# Patient Record
Sex: Male | Born: 1965
Health system: Southern US, Community
[De-identification: ages and names within clinical notes are randomized; demographics above are authoritative.]

## PROBLEM LIST (undated history)

## (undated) DIAGNOSIS — B20 Human immunodeficiency virus [HIV] disease: Secondary | ICD-10-CM

## (undated) HISTORY — DX: Human immunodeficiency virus (HIV) disease: B20

---

## 1997-07-19 ENCOUNTER — Emergency Department (HOSPITAL_COMMUNITY): Admission: EM | Admit: 1997-07-19 | Discharge: 1997-07-19 | Payer: Self-pay | Admitting: *Deleted

## 1997-08-01 ENCOUNTER — Emergency Department (HOSPITAL_COMMUNITY): Admission: EM | Admit: 1997-08-01 | Discharge: 1997-08-01 | Payer: Self-pay | Admitting: Emergency Medicine

## 1997-08-25 ENCOUNTER — Encounter: Admission: RE | Admit: 1997-08-25 | Discharge: 1997-08-25 | Payer: Self-pay | Admitting: *Deleted

## 1998-08-09 ENCOUNTER — Ambulatory Visit (HOSPITAL_COMMUNITY): Admission: EM | Admit: 1998-08-09 | Discharge: 1998-08-10 | Payer: Self-pay | Admitting: Emergency Medicine

## 1998-08-10 ENCOUNTER — Encounter (INDEPENDENT_AMBULATORY_CARE_PROVIDER_SITE_OTHER): Payer: Self-pay | Admitting: Specialist

## 1998-08-10 ENCOUNTER — Encounter: Payer: Self-pay | Admitting: Emergency Medicine

## 2006-05-23 ENCOUNTER — Emergency Department (HOSPITAL_COMMUNITY): Admission: EM | Admit: 2006-05-23 | Discharge: 2006-05-23 | Payer: Self-pay | Admitting: Family Medicine

## 2007-07-03 ENCOUNTER — Emergency Department (HOSPITAL_COMMUNITY): Admission: EM | Admit: 2007-07-03 | Discharge: 2007-07-03 | Payer: Self-pay | Admitting: Emergency Medicine

## 2007-10-09 ENCOUNTER — Emergency Department (HOSPITAL_COMMUNITY): Admission: EM | Admit: 2007-10-09 | Discharge: 2007-10-09 | Payer: Self-pay | Admitting: Emergency Medicine

## 2009-06-09 ENCOUNTER — Emergency Department (HOSPITAL_COMMUNITY)
Admission: EM | Admit: 2009-06-09 | Discharge: 2009-06-09 | Payer: Self-pay | Source: Home / Self Care | Admitting: Family Medicine

## 2010-01-01 ENCOUNTER — Emergency Department (HOSPITAL_COMMUNITY): Admission: EM | Admit: 2010-01-01 | Discharge: 2010-01-01 | Payer: Self-pay | Admitting: Family Medicine

## 2010-04-23 ENCOUNTER — Ambulatory Visit (HOSPITAL_COMMUNITY)
Admission: RE | Admit: 2010-04-23 | Discharge: 2010-04-23 | Disposition: A | Discharge status: Home / Self Care | Payer: Managed Care, Other (non HMO) | Source: Ambulatory Visit | Attending: Internal Medicine | Admitting: Internal Medicine

## 2010-04-23 ENCOUNTER — Ambulatory Visit (INDEPENDENT_AMBULATORY_CARE_PROVIDER_SITE_OTHER): Payer: Managed Care, Other (non HMO) | Admitting: Internal Medicine

## 2010-04-23 ENCOUNTER — Encounter: Payer: Self-pay | Admitting: Internal Medicine

## 2010-04-23 ENCOUNTER — Other Ambulatory Visit: Payer: Self-pay | Admitting: Internal Medicine

## 2010-04-23 DIAGNOSIS — B20 Human immunodeficiency virus [HIV] disease: Secondary | ICD-10-CM | POA: Insufficient documentation

## 2010-04-23 DIAGNOSIS — B370 Candidal stomatitis: Secondary | ICD-10-CM | POA: Insufficient documentation

## 2010-04-23 DIAGNOSIS — L989 Disorder of the skin and subcutaneous tissue, unspecified: Secondary | ICD-10-CM | POA: Insufficient documentation

## 2010-04-23 DIAGNOSIS — R05 Cough: Secondary | ICD-10-CM

## 2010-04-23 DIAGNOSIS — R64 Cachexia: Secondary | ICD-10-CM | POA: Insufficient documentation

## 2010-04-23 DIAGNOSIS — F17200 Nicotine dependence, unspecified, uncomplicated: Secondary | ICD-10-CM

## 2010-04-23 DIAGNOSIS — R059 Cough, unspecified: Secondary | ICD-10-CM | POA: Insufficient documentation

## 2010-04-24 ENCOUNTER — Encounter: Payer: Self-pay | Admitting: Internal Medicine

## 2010-04-24 LAB — CONVERTED CEMR LAB
ALT: 15 units/L (ref 0–53)
AST: 17 units/L (ref 0–37)
Absolute CD4: 24 #/uL — ABNORMAL LOW (ref 381–1469)
Albumin: 4 g/dL (ref 3.5–5.2)
Alkaline Phosphatase: 66 units/L (ref 39–117)
BUN: 17 mg/dL (ref 6–23)
Basophils Absolute: 0 10*3/uL (ref 0.0–0.1)
Basophils Relative: 0 % (ref 0–1)
Bilirubin, Direct: 0.1 mg/dL (ref 0.0–0.3)
CD4 T Helper %: 4 % — ABNORMAL LOW (ref 32–62)
CO2: 27 meq/L (ref 19–32)
Calcium: 8.6 mg/dL (ref 8.4–10.5)
Chloride: 106 meq/L (ref 96–112)
Creatinine, Ser: 1.04 mg/dL (ref 0.40–1.50)
Cytomegalovirus Ab-IgG: POSITIVE — AB
Eosinophils Absolute: 0.1 10*3/uL (ref 0.0–0.7)
Eosinophils Relative: 2 % (ref 0–5)
Glucose, Bld: 85 mg/dL (ref 70–99)
HCT: 38.3 % — ABNORMAL LOW (ref 39.0–52.0)
HCV Ab: NEGATIVE
HIV 1 RNA Quant: 114000 copies/mL — ABNORMAL HIGH (ref <20)
HIV-1 RNA Quant, Log: 5.06 — ABNORMAL HIGH (ref <1.30)
Hemoglobin: 12.9 g/dL — ABNORMAL LOW (ref 13.0–17.0)
Hep A Total Ab: POSITIVE — AB
Hep B Core Total Ab: POSITIVE — AB
Hep B S Ab: POSITIVE — AB
Indirect Bilirubin: 0.2 mg/dL (ref 0.0–0.9)
Lymphocytes Relative: 10 % — ABNORMAL LOW (ref 12–46)
Lymphs Abs: 0.6 10*3/uL — ABNORMAL LOW (ref 0.7–4.0)
MCHC: 33.7 g/dL (ref 30.0–36.0)
MCV: 83.3 fL (ref 78.0–100.0)
Monocytes Absolute: 0.6 10*3/uL (ref 0.1–1.0)
Monocytes Relative: 10 % (ref 3–12)
Neutro Abs: 4.6 10*3/uL (ref 1.7–7.7)
Neutrophils Relative %: 77 % (ref 43–77)
Platelets: 223 10*3/uL (ref 150–400)
Potassium: 3.6 meq/L (ref 3.5–5.3)
RBC: 4.6 M/uL (ref 4.22–5.81)
RDW: 14 % (ref 11.5–15.5)
Sodium: 139 meq/L (ref 135–145)
TSH: 0.104 microintl units/mL — ABNORMAL LOW (ref 0.350–4.500)
Total Bilirubin: 0.3 mg/dL (ref 0.3–1.2)
Total Protein: 6.6 g/dL (ref 6.0–8.3)
Total lymphocyte count: 600 cells/mcL — ABNORMAL LOW (ref 700–3300)
Toxoplasma Antibody- IgM: 0.4
Toxoplasma IgG Ratio: <0.5
WBC: 6 10*3/uL (ref 4.0–10.5)

## 2010-04-24 LAB — T-HELPER CELLS (CD4) COUNT (NOT AT ARMC)
CD4 % Helper T Cell: 3 % — ABNORMAL LOW (ref 33–55)
CD4 T Cell Abs: 20 uL — ABNORMAL LOW (ref 400–2700)

## 2010-04-25 ENCOUNTER — Telehealth: Payer: Self-pay | Admitting: Internal Medicine

## 2010-04-26 ENCOUNTER — Telehealth: Payer: Self-pay | Admitting: Internal Medicine

## 2010-04-30 ENCOUNTER — Encounter: Payer: Self-pay | Admitting: Internal Medicine

## 2010-05-01 NOTE — Assessment & Plan Note (Signed)
Summary: NEW PT / [employer] / [facility] #   Vital Signs:  Patient profile:   45 year old male Height:      56 inches Weight:      120 pounds BMI:     27.00 O2 Sat:      98 % on Room air Temp:     98.5 degrees F oral Pulse rate:   80 / minute Pulse rhythm:   regular Resp:     16 per minute BP sitting:   124 / 62  (left arm)  Vitals Entered By: Rock Nephew CMA (April 23, 2010 3:49 PM)  Nutrition Counseling: Patient's BMI is greater than 25 and therefore counseled on weight management options.  O2 Flow:  Room air  Primary Care Provider:  Etta Grandchild MD   History of Present Illness: New to me he tells me that he has been HIV + since 1993 and has never been treated but he comes in today worried about some symptoms. He has 4 dark lesions on his right thigh and lower leg that have been there for several months that he is worried about. He has had some mouth pain, odynophagia,and weight loss (about 12  pounds).  Preventive Screening-Counseling & Management  Alcohol-Tobacco     Alcohol drinks/day: <1     Alcohol type: spirits     >5/day in last 3 mos: no     Alcohol Counseling: not indicated; use of alcohol is not excessive or problematic     Feels need to cut down: no     Feels annoyed by complaints: no     Feels guilty re: drinking: no     Needs 'eye opener' in am: no     Smoking Status: current     Smoking Cessation Counseling: yes     Smoke Cessation Stage: precontemplative     Packs/Day: 1.0     Year Started: 1988     Pack years: 14     Tobacco Counseling: to quit use of tobacco products  Caffeine-Diet-Exercise     Does Patient Exercise: yes  Hep-HIV-STD-Contraception     Hepatitis Risk: risk noted     Hepatitis Risk Counseling: to avoid increased hepatitis risk     HIV Risk: risk noted     HIV Risk Counseling: to avoid increased HIV risk     STD Risk: risk noted     STD Risk Counseling: to avoid increased STD risk      Sexual History:  currently monogamous and same  sex encounters.        Drug Use:  no.        Blood Transfusions:  no.    Medications Prior to Update: 1)  None  Current Medications (verified): 1)  Fluconazole 200 Mg Tabs (Fluconazole) .... One By Mouth Once Daily 2)  Marinol 5 Mg Caps (Dronabinol) .... 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  The patient denies fever, weight gain, hoarseness, chest pain, syncope, dyspnea on exertion, peripheral edema, prolonged cough, headaches, hemoptysis, abdominal pain, melena, hematochezia, severe indigestion/heartburn, hematuria, muscle weakness, transient blindness, difficulty walking, depression, abnormal bleeding, enlarged lymph nodes, angioedema, and testicular masses.   General:  Complains of loss of appetite and weight loss; denies chills, fatigue, fever, malaise, sleep disorder, sweats, and weakness. Derm:  Complains of lesion(s); denies changes in color of skin, changes in nail beds, dryness, excessive perspiration, flushing, hair loss, insect bite(s), itching, poor wound healing, and rash.  Physical Exam  General:  alert, well-hydrated, cooperative to examination, good hygiene, and cachetic.   Head:  normocephalic, atraumatic, no abnormalities observed, and no abnormalities palpated.   Eyes:  vision  grossly intact, pupils equal, pupils round, pupils reactive to light, and no injection or icterus.   Ears:  R ear normal and L ear normal.   Nose:  External nasal examination shows no deformity or inflammation. Nasal mucosa are pink and moist without lesions or exudates. Mouth:  pharyngeal erythema and glossitis with thrush-like exudate over the posterior pharynx. Neck:  No deformities, masses, or tenderness noted. Lungs:  Normal respiratory effort, chest expands symmetrically. Lungs are clear to auscultation, no crackles or wheezes. Heart:  Normal rate and regular rhythm. S1 and S2 normal without gallop, murmur, click, rub or other extra sounds. Abdomen:  Bowel sounds positive,abdomen soft and non-tender without masses, organomegaly or hernias noted. Genitalia:  circumcised, no hydrocele, no varicocele, no scrotal masses, no testicular masses or atrophy, no cutaneous lesions, and no urethral discharge.   Msk:  No deformity or scoliosis noted of thoracic or lumbar spine.   Pulses:  R and L carotid,radial,femoral,dorsalis pedis and posterior tibial pulses are full and equal bilaterally Extremities:  No clubbing, cyanosis, edema, or deformity noted with normal full range of motion of all joints.   Neurologic:  No cranial nerve deficits noted. Station and gait are normal. Plantar reflexes are down-going bilaterally. DTRs are symmetrical throughout. Sensory, motor and coordinative functions appear intact. Skin:  He has 4 black nodules along his right thigh and lower leg, they are round, symmetrical, 15 mm each, with no exudate, warmth, ttp, or scaling. Cervical Nodes:  no anterior cervical adenopathy and no posterior cervical adenopathy.   Axillary Nodes:  no R axillary adenopathy and no L axillary adenopathy.   Inguinal Nodes:  no R inguinal adenopathy and no L inguinal adenopathy.   Psych:  Cognition and judgment appear intact. Alert and cooperative with normal attention span and concentration. No  apparent delusions, illusions, hallucinations   Impression & Recommendations:  Problem # 1:  AIDS (ICD-042) Assessment New  check labs, I think his T-cell count will be below 200 and that he will need to start OI prophylaxis and to start ART.  Orders: T-CMV IgG Antibody ([id number]) T-CBC w/Diff [phone]) T-CD4SP [facility]) (CD4SP) T-Hepatitis A Antibody (65784-69629) T-Hepatitis B Core Antibody (52841-32440) T-Hepatitis B Surface Antibody (10272-53664) T-Hepatitis C Antibody (40347-42595) T-HIV Viral Load [phone]) T-Toxoplasma Antibody IgG (95188-41660) T-Toxoplasma Antibody IgM (63016-01093) T-RPR (Syphilis) (23557-32202) TLB-BMP (Basic Metabolic Panel-BMET) (80048-METABOL) TLB-TSH (Thyroid Stimulating Hormone) (84443-TSH) TLB-Hepatic/Liver Function Pnl (80076-HEPATIC) TLB-Udip w/ Micro (81001-URINE)  Problem # 2:  MONILIASIS, ORAL (ICD-112.0) Assessment: New start Diflucan Orders: T-CMV IgG Antibody ([id number]) T-CBC w/Diff (28315-17616) T-CD4SP ([facility]) (CD4SP) T-Hepatitis A Antibody (07371-06269) T-Hepatitis B Core Antibody (48546-27035) T-Hepatitis B Surface Antibody (00938-18299) T-Hepatitis C Antibody (37169-67893) T-HIV Viral  Load [phone]) T-Toxoplasma Antibody IgG [phone]) T-Toxoplasma Antibody IgM (46962-95284) T-RPR (Syphilis) (13244-01027) TLB-BMP (Basic Metabolic Panel-BMET) (80048-METABOL) TLB-TSH (Thyroid Stimulating Hormone) (84443-TSH) TLB-Hepatic/Liver Function Pnl (80076-HEPATIC) TLB-Udip w/ Micro (81001-URINE)  Problem # 3:  CACHEXIA (ICD-799.4) Assessment: New start Marinol Orders: T-CMV IgG Antibody (25366-44034) T-CBC w/Diff [phone]) T-CD4SP [facility]) (CD4SP) T-Hepatitis A Antibody (56433-29518) T-Hepatitis B Core Antibody (84166-06301) T-Hepatitis B Surface Antibody (60109-32355) T-Hepatitis C Antibody (73220-25427) T-HIV Viral Load [phone]) T-Toxoplasma Antibody IgG (51761-60737) T-Toxoplasma  Antibody IgM (10626-94854) T-RPR (Syphilis) (62703-50093) TLB-BMP (Basic Metabolic Panel-BMET) (80048-METABOL) TLB-TSH (Thyroid Stimulating Hormone) (84443-TSH) TLB-Hepatic/Liver Function Pnl (80076-HEPATIC) TLB-Udip w/ Micro (81001-URINE)  Problem # 4:  COUGH (ICD-786.2) Assessment: New will look for PCP, masses, etc Orders: T-2 View CXR (71020TC)  Problem # 5:  TOBACCO USE (ICD-305.1) Assessment: New  Encouraged smoking cessation and discussed different methods for smoking cessation.   Problem # 6:  SKIN LESION (ICD-709.9) Assessment: New these are suspicious for KS, will need to get a Dermatologist to confirm with a biopsy Orders: Dermatology Referral (Derma)  Complete Medication List: 1)  Fluconazole 200 Mg Tabs (Fluconazole) .... One by mouth once daily 2)  Marinol 5 Mg Caps (Dronabinol) .... One by mouth two times a day for weight gain  Patient Instructions: 1)  Please schedule a follow-up appointment in 2 weeks. 2)  Tobacco is very bad for your health and your loved ones! You Should stop smoking!. 3)  Stop Smoking Tips: Choose a Quit date. Cut down before the Quit date. decide what you will do as a substitute when you feel the urge to smoke(gum,toothpick,exercise). 4)  If you could be exposed to sexually transmitted diseases, you should use a condom. Prescriptions: MARINOL 5 MG CAPS (DRONABINOL) One by mouth two times a day for weight gain  #60 x 5   Entered and Authorized by:   Etta Grandchild MD   Signed by:   Etta Grandchild MD on 04/23/2010   Method used:   Print then Give to Patient   RxID:   [id number] FLUCONAZOLE 200 MG TABS (FLUCONAZOLE) One by mouth once daily  #30 x 11   Entered and Authorized by:   Etta Grandchild MD   Signed by:   Etta Grandchild MD on 04/23/2010   Method used:   Print then Give to Patient   RxID:   [id number]    Orders Added: 1)  T-CMV IgG Antibody [24235-36144] 2)  T-CBC w/Diff [31540-08676] 3)  T-CD4SP ([facility])  [CD4SP] 4)  T-Hepatitis A Antibody [19509-32671] 5)  T-Hepatitis B Core Antibody [24580-99833] 6)  T-Hepatitis B Surface Antibody [82505-39767] 7)  T-Hepatitis C Antibody [34193-79024] 8)  T-HIV Viral Load [09735-32992] 9)  T-Toxoplasma Antibody IgG [42683-41962] 10)  T-Toxoplasma Antibody IgM [22979-89211] 11)  T-RPR (Syphilis) [94174-08144] 12)  TLB-BMP (Basic Metabolic Panel-BMET) [80048-METABOL] 13)  TLB-TSH (Thyroid Stimulating Hormone) [84443-TSH] 14)  TLB-Hepatic/Liver Function Pnl [80076-HEPATIC] 15)  TLB-Udip w/ Micro [81001-URINE] 16)  T-2 View CXR [71020TC] 17)  Dermatology Referral [Derma] 18)  New Patient Level V [99205]

## 2010-05-01 NOTE — Progress Notes (Signed)
  Phone Note Outgoing Call   Initial call taken by: Etta Grandchild MD,  April 25, 2010 7:35 AM

## 2010-05-10 NOTE — Progress Notes (Signed)
Summary: RESULTS  Phone Note Outgoing Call   Summary of Call: [place] - he has a low T-cell count and needs to start a once daily anitbiotic to prevent PCP, please ask him to identify a pharmacy for me to send the antibiotic to, TJ Initial call taken by: Etta Grandchild MD,  April 26, 2010 11:03 AM  Follow-up for Phone Call        Called number listed on the designated party release, was told pt not avail. Will try back later.Alvy Beal Archie CMA  April 27, 2010 11:25 AM  Follow-up by: Etta Grandchild MD,  April 27, 2010 11:25 AM  Additional Follow-up for Phone Call Additional follow up Details #1::        left mess to call office back.........................Marland KitchenLamar Sprinkles, CMA  April 27, 2010 6:02 PM     Additional Follow-up for Phone Call Additional follow up Details #2::    left mess to call office back, LETTER MAILED Follow-up by: Lamar Sprinkles, CMA,  April 30, 2010 7:45 PM

## 2010-05-10 NOTE — Letter (Signed)
Summary: Generic Letter  [facility]  [address] [place], [place] [address]   Phone: [phone]  Fax: [phone]    04/30/2010  Clarance Rubalcava [address] [place], [place]  [address]  [place]  Dear Mr. Rietz,   Please contact our office regarding lab results as soon as possible.      Sincerely,   Lamar Sprinkles, CMA (AAMA) for Dr Karie Schwalbe. Yetta Barre

## 2010-05-22 ENCOUNTER — Ambulatory Visit: Payer: Managed Care, Other (non HMO)

## 2010-06-07 ENCOUNTER — Ambulatory Visit: Payer: Managed Care, Other (non HMO) | Admitting: Adult Health

## 2011-04-19 ENCOUNTER — Encounter (HOSPITAL_COMMUNITY): Payer: Self-pay | Admitting: Emergency Medicine

## 2011-04-19 ENCOUNTER — Emergency Department (INDEPENDENT_AMBULATORY_CARE_PROVIDER_SITE_OTHER)
Admission: EM | Admit: 2011-04-19 | Discharge: 2011-04-19 | Disposition: A | Discharge status: Home Health | Payer: Managed Care, Other (non HMO) | Source: Home / Self Care | Attending: Emergency Medicine | Admitting: Emergency Medicine

## 2011-04-19 DIAGNOSIS — B370 Candidal stomatitis: Secondary | ICD-10-CM

## 2011-04-19 LAB — POCT RAPID STREP A: Streptococcus, Group A Screen (Direct): NEGATIVE

## 2011-04-19 MED ORDER — NYSTATIN 100000 UNIT/ML MT SUSP: 500000.0000 [IU] | Freq: Four times a day (QID) | OROMUCOSAL | Status: AC

## 2011-04-19 NOTE — ED Notes (Signed)
Pt c/o of sore throat with 6/10 pain which started a week ago.  Red, white patchy ares noted on hard and soft palate of throat.   Denies fever, chills, or cough.

## 2011-04-19 NOTE — Discharge Instructions (Signed)
Thrush, Adult  Ritta Slot is a yeast infection that develops in the mouth and throat and on the tongue. The medical term for this is oropharyngeal candidiasis, or OPC. Ritta Slot is most common in older adults, but it can occur at any age. Ritta Slot occurs when a yeast called candida grows out of control. Candida normally is present in small amounts in the mouth and on other mucous membranes. However, under certain circumstances, candida can grow rapidly, causing thrush. Ritta Slot can be a recurring problem for people who have chronic illnesses or who take medications that limit the body's ability to fight infection (weakened immune system). Since these people have difficulty fighting infections, the fungus that causes thrush can spread throughout the body. This can cause life-threatening blood or organ infections. CAUSES  Candida, the yeast that causes thrush, is normally present in small amounts in the mouth and on other mucous membranes. It usually causes no harm. However, when conditions are present that allow the yeast to grow uncontrolled, it invades surrounding tissues and becomes an infection. Ritta Slot is most commonly caused by the yeast Candida albicans. Less often, other forms of candida can lead to thrush. There are many types of bacteria in your mouth that normally control the growth of candida. Sometimes a new type of bacteria gets into your mouth and disrupts the balance of the germs already there. This can allow candida to overgrow. Other factors that increase your risk of developing thrush include:  An impaired ability to fight infection (weakened immune system). A normal immune system is usually strong enough to prevent candida from overgrowing.   Older adults are more likely to develop thrush because they may have weaker immune systems.   People with human immunodeficiency virus (HIV) infection have a high likelihood of developing thrush. About 90% of people with HIV develop thrush at some point during  the course of their disease.   People with diabetes are more likely to get thrush because high blood sugar levels promote overgrowth of the candida fungus.   A dry mouth (xerostomia). Dry mouth can result from overuse of mouthwashes or from certain conditions such as Sjgren's syndrome.   Pregnancy. Hormone changes during pregnancy can lead to thrush by altering the balance of bacteria in the mouth.   Poor dental care, especially in people who have false teeth.   The use of antibiotic medications. This may lead to thrush by changing the balance of bacteria in the mouth.  SYMPTOMS  Thrush can be a mild infection that causes no symptoms. If symptoms develop, they may include the following:  A burning feeling in the mouth and throat. This can occur at the start of a thrush infection.   White patches that adhere to the mouth and tongue. The tissue around the patches may be red, raw, and painful. If rubbed (during tooth brushing, for example), the patches and the tissue of the mouth may bleed easily.   A bad taste in the mouth or difficulty tasting foods.   Cottony feeling in the mouth.   Sometimes pain during eating and swallowing.  DIAGNOSIS  Your caregiver can usually diagnose thrush by exam. In addition to looking in your mouth, your caregiver will ask you questions about your health. TREATMENT  Medications that help prevent the growth of fungi (antifungals) are the standard treatment for thrush. These medications are either applied directly to the affected area (topical) or swallowed (oral). Mild thrush In adults, mild cases of thrush may clear up with simple treatment that  can be done at home. This treatment usually involves using an antifungal mouth rinse or lozenges. Treatment usually lasts about 14 days. Moderate to severe thrush  More severe thrush infections that have spread to the esophagus are treated with an oral antifungal medication. A topical antifungal medication may also  be used.   For some severe infections, a treatment period longer than 14 days may be needed.   Oral antifungal medications are almost never used during pregnancy because the fetus may be harmed. However, if a pregnant woman has a rare, severe thrush infection that has spread to her blood, oral antifungal medications may be used. In this case, the risk of harm to the mother and fetus from the severe thrush infection may be greater than the risk posed by the use of antifungal medications.  Persistent or recurrent thrush Persistent (does not go away) or recurrent (keeps coming back) cases of thrush may:  Need to be treated twice as long as the symptoms last.   Require treatment with both oral and topical antifungal medications.   People with weakened immune systems can take an antifungal medication on a continuous basis to prevent thrush infections.  It is important to treat conditions that make you more likely to get thrush, such as diabetes, human immunodeficiency virus (HIV), or cancer.  HOME CARE INSTRUCTIONS   If you are breast-feeding, you should clean your nipples with an antifungal medication, such as nystatin (Mycostatin). Dry your nipples after breast-feeding. Applying lanolin-containing body lotion may help relieve nipple soreness.   If you wear dentures and get thrush, remove dentures before going to bed, brush them vigorously, and soak in a solution of chlorhexidine gluconate or a product such as Polident or Efferdent.   Eating plain, unflavored yogurt that contains live cultures (check the label) can also help cure thrush. Yogurt helps healthy bacteria grow in the mouth. These bacteria stop the growth of the yeast that causes thrush.   Adults can treat thrush at home with gentian violet (1%), a dye that kills bacteria and fungi. It is available without a prescription. If there is no known cause for the infection or if gentian violet does not cure the thrush, you need to see your  caregiver.  Comfort measures Measures can be taken to reduce the discomfort of thrush:  Drink cold liquids such as water or iced tea. Eat flavored ice treats or frozen juices.   Eat foods that are easy to swallow such as gelatin, ice cream, or custard.   If the patches are painful, try drinking from a straw.   Rinse your mouth several times a day with a warm saltwater rinse. You can make the saltwater mixture with 1 tsp (5 g) of salt in 8 fl oz (0.2 L) of warm water.  PROGNOSIS   Most cases of thrush are mild and clear up with the use of an antifungal mouth rinse or lozenges. Very mild cases of thrush may clear up without medical treatment. It usually takes about 14 days of treatment with an oral antifungal medication to cure more severe thrush infections. In some cases, thrush may last several weeks even with treatment.   If thrush goes untreated and does not go away by itself, it can spread to other parts of the body.   Thrush can spread to the throat, the vagina, or the skin. It rarely spreads to other organs of the body.  Ritta Slot is more likely to recur (come back) in:  People who use inhaled  to other organs of the body.  Thrush is more likely to recur (come back) in:   People who use inhaled corticosteroids to treat asthma.   People who take antibiotic medications for a long time.   People who have false teeth.   People who have a weakened immune system.  RISKS AND COMPLICATIONS  Complications related to thrush are rare in healthy people.  There are several factors that can increase your risk of developing thrush.  Age  Older adults, especially those who have serious health problems, are more likely to develop thrush because their immune systems are likely to be weaker.  Behavior   The yeast that causes thrush can be spread by oral sex.   Heavy smoking can lower the body's ability to fight off infections. This makes thrush more likely to develop.  Other conditions   False teeth (dentures), braces, or a retainer that irritates the mouth make it hard to keep the mouth clean. An unclean mouth is  more likely to develop thrush than a clean mouth.   People with a weakened immune system, such as those who have diabetes or human immunodeficiency virus (HIV) or who are undergoing chemotherapy, have an increased risk for developing thrush.  Medications  Some medications can allow the fungus that causes thrush to grow uncontrolled. Common ones are:   Antibiotics, especially those that kill a wide range of organisms (broad-spectrum antibiotics), such as tetracycline commonly can cause thrush.   Birth control pills (oral contraceptives).   Medications that weaken the body's immune system, such as corticosteroids.  Environment  Exposure over time to certain environmental chemicals, such as benzene and pesticides, can weaken the body's immune system. This increases your risk for developing infections, including thrush.  SEEK IMMEDIATE MEDICAL CARE IF:   Your symptoms are getting worse or are not improving within 7 days of starting treatment.   You have symptoms of spreading infection, such as white patches on the skin outside of the mouth.   You are nursing and you have redness and pain in the nipples in spite of home treatment or if you have burning pain in the nipple area when you nurse. Your baby's mouth should also be examined to determine whether thrush is causing your symptoms.  Document Released: 10/24/2003 Document Revised: 01/17/2011 Document Reviewed: 02/03/2008  [employer] Patient Information 2012 [employer].

## 2011-04-19 NOTE — ED Provider Notes (Signed)
Chief Complaint  Patient presents with  . Sore Throat    History of Present Illness:   Derek Donovan is a 46 year old male who is tonight with sore throat and white spots on the throat. He had an allergic reaction to medication and was given a prednisone taper which he just finished. The sore throat and white spots appeared while he was on the prednisone. He feels like his throat is swollen and feels it difficult to swallow at times. He denies any fever, chills, nasal congestion, rhinorrhea, or adenopathy. He has had no shortness of breath, coughing, or wheezing. He cannot recall ever having had an HIV test in the past.  Review of Systems:  Other than noted above, the patient denies any of the following symptoms. Systemic:  No fever, chills, sweats, fatigue, myalgias, headache, or anorexia. Eye:  No redness, pain or drainage. ENT:  No earache, nasal congestion, rhinorrhea, sinus pressure, or sore throat. Lungs:  No cough, sputum production, wheezing, shortness of breath. Or chest pain. GI:  No nausea, vomiting, abdominal pain or diarrhea. Skin:  No rash or itching.  PMFSH:  Past medical history, family history, social history, meds, and allergies were reviewed.  Physical Exam:   Vital signs:  BP 109/67  Pulse 92  Temp(Src) 98.3 F (36.8 C) (Oral)  Resp 16  SpO2 99% General:  Alert, in no distress. Eye:  No conjunctival injection or drainage. ENT:  TMs and canals were normal, without erythema or inflammation.  Nasal mucosa was clear and uncongested, without drainage.  Mucous membranes were moist.  Pharynx was mildly erythematous and there were white spots on the soft palate, anterior tonsillar pillars, and posterior pharynx.  There were no oral ulcerations or lesions. Neck:  Supple, no adenopathy, tenderness or mass. Lungs:  No respiratory distress.  Lungs were clear to auscultation, without wheezes, rales or rhonchi.  Breath sounds were clear and equal bilaterally. Heart:  Regular rhythm, without  gallops, murmers or rubs. Skin:  Clear, warm, and dry, without rash or lesions.  Labs:   Results for orders placed during the hospital encounter of 04/19/11  POCT RAPID STREP A ([facility] ONLY)      Component Value Range   Streptococcus, Group A Screen (Direct) NEGATIVE  NEGATIVE      Radiology:  No results found.  Assessment:   Diagnoses that have been ruled out:  None  Diagnoses that are still under consideration:  None  Final diagnoses:  Oral candidiasis      Plan:   1.  The following meds were prescribed:   New Prescriptions   NYSTATIN (MYCOSTATIN) 100000 UNIT/ML SUSPENSION    Take 5 mLs (500,000 Units total) by mouth 4 (four) times daily.   2.  The patient was instructed in symptomatic care and handouts were given. 3.  The patient was told to return if becoming worse in any way, if no better in 3 or 4 days, and given some red flag symptoms that would indicate earlier return. I suggested that he get an HIV test. I would've done one here, but he declined and preferred to have this done by her primary care physician.   Reuben Likes, MD 04/19/11 [phone]

## 2011-07-11 ENCOUNTER — Ambulatory Visit: Payer: Managed Care, Other (non HMO)

## 2011-07-11 ENCOUNTER — Telehealth: Payer: Self-pay

## 2011-07-11 NOTE — Telephone Encounter (Signed)
Message was left on patient's cell phone voicemail.  Pt was scheduled for an intake on 07-11-11 @9 :00 am and he is not here.  Pt was advised he must call me today prior to 11:00 am to be seen by a physician.  He is no longer at the point where missing appointments or no showing for appointments is optional.  His health is a risk.    Pt was told to speak with me directly due to the possibility I may be able to work him into a physician schedule this morning due to a cancellation.    Laurell Josephs, RN

## 2011-07-15 ENCOUNTER — Telehealth: Payer: Self-pay

## 2011-07-18 ENCOUNTER — Encounter: Payer: Self-pay | Admitting: *Deleted

## 2011-08-26 ENCOUNTER — Ambulatory Visit (INDEPENDENT_AMBULATORY_CARE_PROVIDER_SITE_OTHER): Payer: Managed Care, Other (non HMO) | Admitting: Physician Assistant

## 2011-08-26 VITALS — BP 100/66 | HR 99 | Temp 98.7°F | Resp 16 | Ht 65.0 in | Wt 123.6 lb

## 2011-08-26 DIAGNOSIS — L259 Unspecified contact dermatitis, unspecified cause: Secondary | ICD-10-CM

## 2011-08-26 DIAGNOSIS — L309 Dermatitis, unspecified: Secondary | ICD-10-CM

## 2011-08-26 MED ORDER — DOXYCYCLINE HYCLATE 100 MG PO TABS: 100.0000 mg | ORAL_TABLET | Freq: Two times a day (BID) | ORAL | Status: AC

## 2011-08-26 MED ORDER — PREDNISONE 10 MG PO TABS: ORAL_TABLET | ORAL | Status: DC

## 2011-08-26 MED ORDER — METHYLPREDNISOLONE ACETATE 80 MG/ML IJ SUSP
80.0000 mg | Freq: Once | INTRAMUSCULAR | Status: AC
  Administered 2011-08-26: 80 mg via INTRAMUSCULAR

## 2011-08-26 NOTE — Progress Notes (Signed)
  Subjective:    Patient ID: Derek Donovan, male    DOB: Feb 29, 1965, 46 y.o.   MRN: [id number]  HPI Mr. Ybarra comes in tonight with a recurring rash.  It is on his face and back and states that it is very itchy.  He has been here multiple times before and received Doxy and prednisone and states that this helps him a lot.  He thinks that it's because he works around [employer].  He states that he has a work PEs only and can not remember who is doctor I asked about labs he has had done in past.  He states that his HIV test has been done "within the year" and is "fine"  HE STATES MULTIPLE TIMES THAT HE IS HEALTHY OTHERWISE      Review of Systems As noted in HPI, otherwise negative     Objective:   Physical Exam  Lymphadenopathy:    He has no cervical adenopathy.  Skin:       Forehead, scalp and neck with papulopustular lesions.  Acne like.  No urticaria, vesicles. Hyperpigmented lesions noted.          Assessment & Plan:  Rash, recurrent  Concern that this may not be from exposure to chemicals but from a systemic process.  He states many times that he has had Doxy and prednisone in the past with great results so I will prescribe this regimen.  Offered Derm referral

## 2011-10-03 ENCOUNTER — Ambulatory Visit (INDEPENDENT_AMBULATORY_CARE_PROVIDER_SITE_OTHER): Payer: Managed Care, Other (non HMO)

## 2011-10-03 ENCOUNTER — Other Ambulatory Visit: Payer: Self-pay | Admitting: Internal Medicine

## 2011-10-03 DIAGNOSIS — B20 Human immunodeficiency virus [HIV] disease: Secondary | ICD-10-CM

## 2011-10-03 DIAGNOSIS — Z21 Asymptomatic human immunodeficiency virus [HIV] infection status: Secondary | ICD-10-CM

## 2011-10-03 DIAGNOSIS — Z23 Encounter for immunization: Secondary | ICD-10-CM

## 2011-10-03 DIAGNOSIS — Z113 Encounter for screening for infections with a predominantly sexual mode of transmission: Secondary | ICD-10-CM

## 2011-10-03 LAB — COMPLETE METABOLIC PANEL WITH GFR
ALT: 19 U/L (ref 0–53)
AST: 23 U/L (ref 0–37)
Alkaline Phosphatase: 62 U/L (ref 39–117)
CO2: 25 mEq/L (ref 19–32)
Creat: 1.15 mg/dL (ref 0.50–1.35)
Sodium: 142 mEq/L (ref 135–145)
Total Bilirubin: 0.5 mg/dL (ref 0.3–1.2)
Total Protein: 6.9 g/dL (ref 6.0–8.3)

## 2011-10-03 LAB — HEPATITIS B SURFACE ANTIGEN: Hepatitis B Surface Ag: NEGATIVE

## 2011-10-03 LAB — HEPATITIS B SURFACE ANTIBODY,QUALITATIVE: Hep B S Ab: POSITIVE — AB

## 2011-10-03 LAB — HEPATITIS C ANTIBODY: HCV Ab: NEGATIVE

## 2011-10-03 LAB — LIPID PANEL
HDL: 32 mg/dL — ABNORMAL LOW (ref >39)
LDL Cholesterol: 78 mg/dL (ref 0–99)

## 2011-10-04 LAB — CBC WITH DIFFERENTIAL/PLATELET
Basophils Absolute: 0 10*3/uL (ref 0.0–0.1)
Eosinophils Absolute: 0.2 10*3/uL (ref 0.0–0.7)
Eosinophils Relative: 8 % — ABNORMAL HIGH (ref 0–5)
Lymphs Abs: 0.5 10*3/uL — ABNORMAL LOW (ref 0.7–4.0)
MCH: 29 pg (ref 26.0–34.0)
MCV: 84.9 fL (ref 78.0–100.0)
Neutrophils Relative %: 54 % (ref 43–77)
Platelets: 207 10*3/uL (ref 150–400)
RBC: 4.38 MIL/uL (ref 4.22–5.81)
RDW: 15.1 % (ref 11.5–15.5)
WBC: 2.5 10*3/uL — ABNORMAL LOW (ref 4.0–10.5)

## 2011-10-04 LAB — HEPATITIS B CORE ANTIBODY, TOTAL: Hep B Core Total Ab: POSITIVE — AB

## 2011-10-04 LAB — HIV-1 RNA ULTRAQUANT REFLEX TO GENTYP+
HIV 1 RNA Quant: 80118 copies/mL — ABNORMAL HIGH (ref <20)
HIV-1 RNA Quant, Log: 4.9 {Log} — ABNORMAL HIGH (ref <1.30)

## 2011-10-04 LAB — URINALYSIS
Hgb urine dipstick: NEGATIVE
Ketones, ur: NEGATIVE mg/dL
Leukocytes, UA: NEGATIVE
Nitrite: NEGATIVE
Protein, ur: NEGATIVE mg/dL
pH: 7 (ref 5.0–8.0)

## 2011-10-04 LAB — HEPATITIS A ANTIBODY, TOTAL: Hep A Total Ab: POSITIVE — AB

## 2011-10-07 ENCOUNTER — Telehealth: Payer: Self-pay | Admitting: *Deleted

## 2011-10-07 LAB — TB SKIN TEST: Induration: 0 mm

## 2011-10-07 NOTE — Telephone Encounter (Signed)
Entered PPD result as Negative.

## 2011-10-09 LAB — HIV-1 GENOTYPR PLUS

## 2011-10-11 NOTE — Progress Notes (Signed)
[place] was [place] [id number]. He did not seek care and states it was related to the shame of having the disease and fear of others finding out he was HIV positive.   He is now ready for care. He has never taken HAART.   Pt currently has a rash on his face, upper and lower body.  His primary care physician treated rash with hydrocortisone cream.  Rash present greater than 4 months.    Laurell Josephs, RN

## 2011-10-17 ENCOUNTER — Ambulatory Visit (INDEPENDENT_AMBULATORY_CARE_PROVIDER_SITE_OTHER): Payer: Managed Care, Other (non HMO) | Admitting: Internal Medicine

## 2011-10-17 ENCOUNTER — Encounter: Payer: Self-pay | Admitting: Internal Medicine

## 2011-10-17 VITALS — BP 118/82 | HR 86 | Temp 98.3°F | Ht 66.0 in | Wt 123.0 lb

## 2011-10-17 DIAGNOSIS — Z21 Asymptomatic human immunodeficiency virus [HIV] infection status: Secondary | ICD-10-CM

## 2011-10-17 DIAGNOSIS — B20 Human immunodeficiency virus [HIV] disease: Secondary | ICD-10-CM

## 2011-10-17 MED ORDER — SULFAMETHOXAZOLE-TMP DS 800-160 MG PO TABS: 1.0000 | ORAL_TABLET | Freq: Every day | ORAL | Status: AC

## 2011-10-17 MED ORDER — AZITHROMYCIN 600 MG PO TABS: 1200.0000 mg | ORAL_TABLET | ORAL | Status: AC

## 2011-10-17 MED ORDER — ELVITEG-COBIC-EMTRICIT-TENOFDF 150-150-200-300 MG PO TABS: 1.0000 | ORAL_TABLET | Freq: Every day | ORAL | Status: DC

## 2011-10-17 NOTE — Progress Notes (Signed)
HIV CLINIC NOTE  RFV: establish care Subjective:    Patient ID: Derek Donovan, male    DOB: [birth year], 46 y.o.   MRN: [id number]  HPI Derek Donovan is a pleasant 46 yo Male with HIV, dx 6-7 yrs ago. Recent Cd 4 count < 10, HIV VL 80,118, genotype naive, ART naive. In our system CD 4 count 20 in 2012.  Diagnosed by PCP in setting of being asymptomatic; but never been on ART. RF include MSM,presumed sex with HIV+. No illicit drug use. He has had numerous friends who have had HIV, died "due to AZT treatment"  He has been in good state of health except for occassional bouts of folliculitis recently. Has had course of prednisone in the past. But developed thrush. Now on fluconazole for the past 2 wks.  hepBscore ab+ Hep B sAB+ HepA+ hcv -   No current outpatient prescriptions on file prior to visit.   Active Ambulatory Problems    Diagnosis Date Noted  . Human immunodeficiency virus (HIV) disease 04/23/2010  . MONILIASIS, ORAL 04/23/2010  . TOBACCO USE 04/23/2010  . SKIN LESION 04/23/2010  . CACHEXIA 04/23/2010   Resolved Ambulatory Problems    Diagnosis Date Noted  . No Resolved Ambulatory Problems   Past Medical History  Diagnosis Date  . HIV infection    Social hx: [employer], work full time 43yr. Smoke about 1/2ppd x 12yr . occ etoh. Not relationship. Has disclosed to his sister  Family hx: mom has HTN   Review of Systems  Constitutional: Negative for fever, chills, diaphoresis, activity change, appetite change, fatigue and unexpected weight change.  HENT: positive for thrush. Negative for congestion, sore throat, rhinorrhea, sneezing, trouble swallowing and sinus pressure.  Eyes: Negative for photophobia and visual disturbance.  Respiratory: Negative for cough, chest tightness, shortness of breath, wheezing and stridor.  Cardiovascular: Negative for chest pain, palpitations and leg swelling.  Gastrointestinal: Negative for nausea, vomiting, abdominal pain, diarrhea,  constipation, blood in stool, abdominal distention and anal bleeding.  Genitourinary: Negative for dysuria, hematuria, flank pain and difficulty urinating.  Musculoskeletal: Negative for myalgias, back pain, joint swelling, arthralgias and gait problem.  Skin: Negative for color change, pallor, rash and wound.  Neurological: Negative for dizziness, tremors, weakness and light-headedness.  Hematological: Negative for adenopathy. Does not bruise/bleed easily.  Psychiatric/Behavioral: Negative for behavioral problems, confusion, sleep disturbance, dysphoric mood, decreased concentration and agitation.       Objective:   Physical Exam BP 118/82  Pulse 86  Temp 98.3 F (36.8 C) (Oral)  Ht 5\' 6"  (1.676 m)  Wt 123 lb (55.792 kg)  BMI 19.85 kg/m2 Physical Exam  Constitutional: He is oriented to person, place, and time. He appears well-developed and well-nourished. No distress.  HENT:  Mouth/Throat: Oropharynx is clear and moist. No oropharyngeal exudate. occ thrush on buccal mucosa L>R Cardiovascular: Normal rate, regular rhythm and normal heart sounds. Exam reveals no gallop and no friction rub.  No murmur heard.  Pulmonary/Chest: Effort normal and breath sounds normal. No respiratory distress. He has no wheezes.  Abdominal: Soft. Bowel sounds are normal. He exhibits no distension. There is no tenderness.  Lymphadenopathy:  He has no cervical adenopathy.  Neurological: He is alert and oriented to person, place, and time.  Skin: Skin is warm and dry. No rash noted. No erythema.  Psychiatric: He has a normal mood and affect. His behavior is normal.          Assessment & Plan:   HIV=  will start therapy; discussed various 1 pill a day options and have decided on stribild. Gave coupon card and instructions to how to use it. Reviewed the importance of 100% adherence.  Thrush = continue with fluconazole 100mg  daily  OI proph= bactrim DS daily and azithromycin weekly  rtc in 4-6 wk,  will do blood tests at that time  Asked him to call if problems with meds Dr. Renae Gloss - PCP,

## 2011-11-19 ENCOUNTER — Other Ambulatory Visit: Payer: Self-pay | Admitting: Internal Medicine

## 2011-11-19 ENCOUNTER — Other Ambulatory Visit (INDEPENDENT_AMBULATORY_CARE_PROVIDER_SITE_OTHER): Payer: Managed Care, Other (non HMO)

## 2011-11-19 DIAGNOSIS — B20 Human immunodeficiency virus [HIV] disease: Secondary | ICD-10-CM

## 2011-11-19 DIAGNOSIS — Z21 Asymptomatic human immunodeficiency virus [HIV] infection status: Secondary | ICD-10-CM

## 2011-11-19 LAB — COMPREHENSIVE METABOLIC PANEL
ALT: 23 U/L (ref 0–53)
AST: 22 U/L (ref 0–37)
Albumin: 4.2 g/dL (ref 3.5–5.2)
Calcium: 9 mg/dL (ref 8.4–10.5)
Chloride: 107 mEq/L (ref 96–112)
Potassium: 4.2 mEq/L (ref 3.5–5.3)
Total Protein: 6.9 g/dL (ref 6.0–8.3)

## 2011-11-20 LAB — CBC WITH DIFFERENTIAL/PLATELET
Basophils Absolute: 0.1 10*3/uL (ref 0.0–0.1)
HCT: 34.8 % — ABNORMAL LOW (ref 39.0–52.0)
Lymphocytes Relative: 24 % (ref 12–46)
Lymphs Abs: 1.1 10*3/uL (ref 0.7–4.0)
Neutro Abs: 1.9 10*3/uL (ref 1.7–7.7)
Platelets: 283 10*3/uL (ref 150–400)
RBC: 3.99 MIL/uL — ABNORMAL LOW (ref 4.22–5.81)
RDW: 15.9 % — ABNORMAL HIGH (ref 11.5–15.5)
WBC: 4.6 10*3/uL (ref 4.0–10.5)

## 2011-11-20 LAB — HIV-1 RNA QUANT-NO REFLEX-BLD: HIV-1 RNA Quant, Log: 2.13 {Log} — ABNORMAL HIGH (ref <1.30)

## 2011-12-03 ENCOUNTER — Ambulatory Visit: Payer: Managed Care, Other (non HMO) | Admitting: Internal Medicine

## 2012-01-02 ENCOUNTER — Ambulatory Visit (INDEPENDENT_AMBULATORY_CARE_PROVIDER_SITE_OTHER): Payer: Managed Care, Other (non HMO) | Admitting: Internal Medicine

## 2012-01-02 ENCOUNTER — Encounter: Payer: Self-pay | Admitting: Internal Medicine

## 2012-01-02 VITALS — BP 131/90 | HR 82 | Temp 98.1°F | Wt 136.0 lb

## 2012-01-02 DIAGNOSIS — Z21 Asymptomatic human immunodeficiency virus [HIV] infection status: Secondary | ICD-10-CM

## 2012-01-02 DIAGNOSIS — B20 Human immunodeficiency virus [HIV] disease: Secondary | ICD-10-CM

## 2012-01-02 MED ORDER — SULFAMETHOXAZOLE-TMP DS 800-160 MG PO TABS: 1.0000 | ORAL_TABLET | Freq: Every day | ORAL | Status: DC

## 2012-01-02 MED ORDER — AZITHROMYCIN 600 MG PO TABS: 600.0000 mg | ORAL_TABLET | ORAL | Status: DC

## 2012-01-02 NOTE — Progress Notes (Signed)
HIV CLINIC NOTE   RFV: routine follow up Subjective:    Patient ID: Derek Donovan, male    DOB: 08-Jun-1965, 46 y.o.   MRN: [id number]  HPI CD 4 count 30, VL 136. Doing well with stribild. Has gained weight since last visit #12. Has been on stribild for 2 months now and doing great. Has good virologic control but slow increase in cd 4 count. Still noticing breaking out on your face and back  Current Outpatient Prescriptions on File Prior to Visit  Medication Sig Dispense Refill  . elvitegravir-cobicistat-emtricitabine-tenofovir (STRIBILD) 150-150-200-300 MG TABS Take 1 tablet by mouth daily with breakfast.  30 tablet  5  . fluconazole (DIFLUCAN) 100 MG tablet Take 100 mg by mouth daily.       - bactrim DS daily - azithromycin 600mg   2 tabs every Sunday   Review of Systems Review of Systems  Constitutional: Negative for fever, chills, diaphoresis, activity change, appetite change, fatigue and unexpected weight gain HENT: Negative for congestion, sore throat, rhinorrhea, sneezing, trouble swallowing and sinus pressure.  Eyes: Negative for photophobia and visual disturbance.  Respiratory: Negative for cough, chest tightness, shortness of breath, wheezing and stridor.  Cardiovascular: Negative for chest pain, palpitations and leg swelling.  Gastrointestinal: Negative for nausea, vomiting, abdominal pain, diarrhea, constipation, blood in stool, abdominal distention and anal bleeding.  Genitourinary: Negative for dysuria, hematuria, flank pain and difficulty urinating.  Musculoskeletal: Negative for myalgias, back pain, joint swelling, arthralgias and gait problem.  Skin: Negative for color change, pallor, rash and wound.  Neurological: Negative for dizziness, tremors, weakness and light-headedness.  Hematological: Negative for adenopathy. Does not bruise/bleed easily.  Psychiatric/Behavioral: Negative for behavioral problems, confusion, sleep disturbance, dysphoric mood, decreased  concentration and agitation.       Objective:   Physical Exam BP 131/90  Pulse 82  Temp 98.1 F (36.7 C) (Oral)  Wt 136 lb (61.689 kg) Physical Exam  Constitutional: He is oriented to person, place, and time. He appears well-developed and well-nourished. No distress.  HENT:  Mouth/Throat: Oropharynx is clear and moist. No oropharyngeal exudate.  Cardiovascular: Normal rate, regular rhythm and normal heart sounds. Exam reveals no gallop and no friction rub.  No murmur heard.  Pulmonary/Chest: Effort normal and breath sounds normal. No respiratory distress. He has no wheezes.  Abdominal: Soft. Bowel sounds are normal. He exhibits no distension. There is no tenderness.  Lymphadenopathy:  He has no cervical adenopathy.  Neurological: He is alert and oriented to person, place, and time.  Skin: Skin is warm and dry. No rash noted. No erythema.  Psychiatric: He has a normal mood and affect. His behavior is normal.          Assessment & Plan:  HIV =doing very well on Stribild. Continue with great adherence  OI proph= continue with bactrim DS daily, and azithro q wk  Thrush = resolved  follicitis = only new lesions to neck and face. Back has numerous scars but no new lesions.  rtc in 3 months

## 2012-05-14 ENCOUNTER — Encounter: Payer: Self-pay | Admitting: *Deleted

## 2012-12-01 ENCOUNTER — Other Ambulatory Visit: Payer: 59

## 2012-12-01 DIAGNOSIS — B20 Human immunodeficiency virus [HIV] disease: Secondary | ICD-10-CM

## 2012-12-01 LAB — COMPLETE METABOLIC PANEL WITH GFR
Albumin: 4.3 g/dL (ref 3.5–5.2)
Alkaline Phosphatase: 71 U/L (ref 39–117)
BUN: 16 mg/dL (ref 6–23)
Calcium: 9 mg/dL (ref 8.4–10.5)
GFR, Est Non African American: 78 mL/min
Glucose, Bld: 95 mg/dL (ref 70–99)
Potassium: 4.5 mEq/L (ref 3.5–5.3)

## 2012-12-01 NOTE — Addendum Note (Signed)
Addended bySteva Colder on: 12/01/2012 02:40 PM   Modules accepted: Orders

## 2012-12-02 LAB — CBC WITH DIFFERENTIAL/PLATELET
Basophils Relative: 2 % — ABNORMAL HIGH (ref 0–1)
Eosinophils Absolute: 0.3 10*3/uL (ref 0.0–0.7)
Eosinophils Relative: 16 % — ABNORMAL HIGH (ref 0–5)
HCT: 39.7 % (ref 39.0–52.0)
Hemoglobin: 13.9 g/dL (ref 13.0–17.0)
MCH: 31.3 pg (ref 26.0–34.0)
MCHC: 35 g/dL (ref 30.0–36.0)
MCV: 89.4 fL (ref 78.0–100.0)
Monocytes Absolute: 0.4 10*3/uL (ref 0.1–1.0)
Monocytes Relative: 24 % — ABNORMAL HIGH (ref 3–12)
Neutro Abs: 0.6 10*3/uL — ABNORMAL LOW (ref 1.7–7.7)

## 2012-12-02 LAB — T-HELPER CELL (CD4) - (RCID CLINIC ONLY): CD4 T Cell Abs: 10 /uL — ABNORMAL LOW (ref 400–2700)

## 2012-12-04 LAB — HIV-1 GENOTYPR PLUS

## 2012-12-22 ENCOUNTER — Ambulatory Visit (INDEPENDENT_AMBULATORY_CARE_PROVIDER_SITE_OTHER): Payer: 59 | Admitting: Internal Medicine

## 2012-12-22 VITALS — BP 153/95 | HR 88 | Temp 100.0°F | Wt 137.0 lb

## 2012-12-22 DIAGNOSIS — Z21 Asymptomatic human immunodeficiency virus [HIV] infection status: Secondary | ICD-10-CM

## 2012-12-22 DIAGNOSIS — B20 Human immunodeficiency virus [HIV] disease: Secondary | ICD-10-CM

## 2012-12-22 MED ORDER — DOLUTEGRAVIR SODIUM 50 MG PO TABS: 50.0000 mg | ORAL_TABLET | Freq: Every day | ORAL | Status: DC

## 2012-12-22 MED ORDER — FLUCONAZOLE 100 MG PO TABS: 100.0000 mg | ORAL_TABLET | ORAL | Status: DC

## 2012-12-22 MED ORDER — AZITHROMYCIN 600 MG PO TABS: 600.0000 mg | ORAL_TABLET | ORAL | Status: DC

## 2012-12-22 MED ORDER — EMTRICITABINE-TENOFOVIR DF 200-300 MG PO TABS: 1.0000 | ORAL_TABLET | Freq: Every day | ORAL | Status: DC

## 2012-12-22 MED ORDER — SULFAMETHOXAZOLE-TMP DS 800-160 MG PO TABS: 1.0000 | ORAL_TABLET | Freq: Every day | ORAL | Status: DC

## 2012-12-22 NOTE — Progress Notes (Signed)
[facility] HIV CLINIC NOTE  RFV: routine hiv Subjective:    Patient ID: Derek Donovan, male    DOB: 05-09-1965, 47 y.o.   MRN: [id number]  HPI 46yo M, off meds for 7 months since losing his job, loss his job at [employer] where he worked for 12-15 yr. Still able to stay in apartment. He now has insurance through new job of working at [employer] in [employer] affairs [employer]. Concern for people to know his hiv status  Current Outpatient Prescriptions on File Prior to Visit  Medication Sig Dispense Refill  . azithromycin (ZITHROMAX) 600 MG tablet Take 1 tablet (600 mg total) by mouth every 7 (seven) days.  10 tablet  5  . elvitegravir-cobicistat-emtricitabine-tenofovir (STRIBILD) 150-150-200-300 MG TABS Take 1 tablet by mouth daily with breakfast.  30 tablet  5  . fluconazole (DIFLUCAN) 100 MG tablet Take 100 mg by mouth daily.      Marland Kitchen sulfamethoxazole-trimethoprim (BACTRIM DS) 800-160 MG per tablet Take 1 tablet by mouth daily.  30 tablet  11   No current facility-administered medications on file prior to visit.   Active Ambulatory Problems    Diagnosis Date Noted  . Human immunodeficiency virus (HIV) disease 04/23/2010  . MONILIASIS, ORAL 04/23/2010  . TOBACCO USE 04/23/2010  . SKIN LESION 04/23/2010  . CACHEXIA 04/23/2010   Resolved Ambulatory Problems    Diagnosis Date Noted  . No Resolved Ambulatory Problems   Past Medical History  Diagnosis Date  . HIV infection       Review of Systems     Objective:   Physical Exam BP 153/95  Pulse 88  Temp(Src) 100 F (37.8 C) (Oral)  Wt 137 lb (62.143 kg) Physical Exam  Constitutional: He is oriented to person, place, and time. He appears well-developed and well-nourished. No distress.  HENT:  Mouth/Throat: Oropharynx is clear and moist. No oropharyngeal exudate.  Cardiovascular: Normal rate, regular rhythm and normal heart sounds. Exam reveals no gallop and no friction rub.  No murmur heard.  Pulmonary/Chest: Effort normal and  breath sounds normal. No respiratory distress. He has no wheezes.  Abdominal: Soft. Bowel sounds are normal. He exhibits no distension. There is no tenderness.  Lymphadenopathy:  He has no cervical adenopathy.  Neurological: He is alert and oriented to person, place, and time.  Skin: Skin is warm and dry. No rash noted. No erythema.  Psychiatric: He has a normal mood and affect. His behavior is normal.        Assessment & Plan:   hiv = start on tivicay and truvada daily  oi proph = will start azithromycin weekly, fluc weekly and bactrim DS daily  Health maintenance = had his flu shot  Disclosure = introduced him to alyson, on [facility]  rtc in 4 wk

## 2013-01-19 ENCOUNTER — Ambulatory Visit: Payer: 59 | Admitting: Internal Medicine

## 2013-03-23 ENCOUNTER — Other Ambulatory Visit: Payer: Self-pay | Admitting: Internal Medicine

## 2013-03-23 DIAGNOSIS — B20 Human immunodeficiency virus [HIV] disease: Secondary | ICD-10-CM

## 2013-03-23 NOTE — Progress Notes (Signed)
Called pt to let him know we would like him to get labs and have him get appt

## 2013-04-11 ENCOUNTER — Other Ambulatory Visit: Payer: Self-pay | Admitting: Internal Medicine

## 2013-06-30 ENCOUNTER — Other Ambulatory Visit: Payer: Self-pay | Admitting: Internal Medicine

## 2013-07-12 ENCOUNTER — Other Ambulatory Visit: Payer: 59

## 2013-08-11 ENCOUNTER — Ambulatory Visit: Payer: 59 | Admitting: Internal Medicine

## 2013-10-14 ENCOUNTER — Telehealth: Payer: Self-pay | Admitting: *Deleted

## 2013-10-14 NOTE — Telephone Encounter (Signed)
Called the patient to see if we could schedule an appt soon. Had to leave a message for him to call the office asap.

## 2013-10-27 ENCOUNTER — Other Ambulatory Visit: Payer: Self-pay | Admitting: *Deleted

## 2013-10-27 DIAGNOSIS — Z113 Encounter for screening for infections with a predominantly sexual mode of transmission: Secondary | ICD-10-CM

## 2013-11-29 ENCOUNTER — Encounter: Payer: Self-pay | Admitting: *Deleted

## 2013-11-29 NOTE — Progress Notes (Signed)
Patient ID: Derek Donovan, male   DOB: 1965-11-28, 48 y.o.   MRN: [id number] [facility] Ref[facility] - detectable VL, last OV 12/22/12

## 2013-12-31 ENCOUNTER — Other Ambulatory Visit: Payer: Self-pay | Admitting: *Deleted

## 2013-12-31 DIAGNOSIS — B20 Human immunodeficiency virus [HIV] disease: Secondary | ICD-10-CM

## 2013-12-31 MED ORDER — EMTRICITABINE-TENOFOVIR DF 200-300 MG PO TABS: 1.0000 | ORAL_TABLET | Freq: Every day | ORAL | Status: DC

## 2013-12-31 MED ORDER — DOLUTEGRAVIR SODIUM 50 MG PO TABS
50.0000 mg | ORAL_TABLET | Freq: Every day | ORAL | Status: DC
Start: 2013-12-31 — End: 2014-05-24

## 2014-01-25 ENCOUNTER — Ambulatory Visit: Payer: 59

## 2014-01-31 ENCOUNTER — Other Ambulatory Visit: Payer: 59

## 2014-02-16 ENCOUNTER — Other Ambulatory Visit: Payer: Self-pay | Admitting: Internal Medicine

## 2014-02-16 ENCOUNTER — Encounter: Payer: Self-pay | Admitting: Internal Medicine

## 2014-02-16 ENCOUNTER — Ambulatory Visit (INDEPENDENT_AMBULATORY_CARE_PROVIDER_SITE_OTHER): Payer: 59 | Admitting: Internal Medicine

## 2014-02-16 VITALS — BP 152/103 | HR 91 | Temp 98.2°F | Wt 135.0 lb

## 2014-02-16 DIAGNOSIS — B20 Human immunodeficiency virus [HIV] disease: Secondary | ICD-10-CM

## 2014-02-16 DIAGNOSIS — Z79899 Other long term (current) drug therapy: Secondary | ICD-10-CM

## 2014-02-16 DIAGNOSIS — Z21 Asymptomatic human immunodeficiency virus [HIV] infection status: Secondary | ICD-10-CM

## 2014-02-16 DIAGNOSIS — Z113 Encounter for screening for infections with a predominantly sexual mode of transmission: Secondary | ICD-10-CM

## 2014-02-16 LAB — CBC WITH DIFFERENTIAL/PLATELET
Basophils Absolute: 0 10*3/uL (ref 0.0–0.1)
Basophils Relative: 1 % (ref 0–1)
EOS PCT: 9 % — AB (ref 0–5)
Eosinophils Absolute: 0.3 10*3/uL (ref 0.0–0.7)
HEMATOCRIT: 42.1 % (ref 39.0–52.0)
Hemoglobin: 14.3 g/dL (ref 13.0–17.0)
Lymphocytes Relative: 17 % (ref 12–46)
Lymphs Abs: 0.6 10*3/uL — ABNORMAL LOW (ref 0.7–4.0)
MCH: 28.9 pg (ref 26.0–34.0)
MCHC: 34 g/dL (ref 30.0–36.0)
MCV: 85.1 fL (ref 78.0–100.0)
MONO ABS: 0.7 10*3/uL (ref 0.1–1.0)
MPV: 9.1 fL (ref 8.6–12.4)
Monocytes Relative: 19 % — ABNORMAL HIGH (ref 3–12)
Neutro Abs: 2.1 10*3/uL (ref 1.7–7.7)
Neutrophils Relative %: 54 % (ref 43–77)
Platelets: 223 10*3/uL (ref 150–400)
RBC: 4.95 MIL/uL (ref 4.22–5.81)
RDW: 14.9 % (ref 11.5–15.5)
WBC: 3.8 10*3/uL — ABNORMAL LOW (ref 4.0–10.5)

## 2014-02-16 LAB — COMPLETE METABOLIC PANEL WITH GFR
ALBUMIN: 4.1 g/dL (ref 3.5–5.2)
ALK PHOS: 61 U/L (ref 39–117)
ALT: 12 U/L (ref 0–53)
AST: 16 U/L (ref 0–37)
BILIRUBIN TOTAL: 0.4 mg/dL (ref 0.2–1.2)
BUN: 17 mg/dL (ref 6–23)
CALCIUM: 8.9 mg/dL (ref 8.4–10.5)
CO2: 27 meq/L (ref 19–32)
Chloride: 105 mEq/L (ref 96–112)
Creat: 1.07 mg/dL (ref 0.50–1.35)
GFR, EST NON AFRICAN AMERICAN: 81 mL/min
Glucose, Bld: 73 mg/dL (ref 70–99)
Potassium: 4.4 mEq/L (ref 3.5–5.3)
SODIUM: 139 meq/L (ref 135–145)
TOTAL PROTEIN: 7.7 g/dL (ref 6.0–8.3)

## 2014-02-16 LAB — LIPID PANEL
Cholesterol: 145 mg/dL (ref 0–200)
HDL: 34 mg/dL — ABNORMAL LOW (ref >39)
LDL CALC: 81 mg/dL (ref 0–99)
Total CHOL/HDL Ratio: 4.3 Ratio
Triglycerides: 152 mg/dL — ABNORMAL HIGH (ref <150)
VLDL: 30 mg/dL (ref 0–40)

## 2014-02-16 NOTE — Progress Notes (Signed)
Patient ID: Derek Donovan, male   DOB: October 29, 1965, 49 y.o.   MRN: [id number]       Patient ID: Derek Donovan, male   DOB: 08/02/1965, 49 y.o.   MRN: [id number]  HPI 49yo M with HIV disease, CD 4 count of 10/VL 146,000, started on truvada/tivicay in Fall 2014. As well as OI. Has not been to clinic in 14 months. He states he has been taking his medications as directed. Continues to work at [employer] in [employer], recently awarded the employee of the month. Not sexually active. Concern about right hand finger nail darkening after injury. No recent illness. Received vaccination thru work   Outpatient Encounter Prescriptions as of 02/16/2014  Medication Sig  . dolutegravir (TIVICAY) 50 MG tablet Take 1 tablet (50 mg total) by mouth daily.  Marland Kitchen emtricitabine-tenofovir (TRUVADA) 200-300 MG per tablet Take 1 tablet by mouth daily.  . fluconazole (DIFLUCAN) 100 MG tablet TAKE 1 TABLET BY MOUTH ONCE A WEEK  . sulfamethoxazole-trimethoprim (BACTRIM DS) 800-160 MG per tablet Take 1 tablet by mouth daily.  . [DISCONTINUED] azithromycin (ZITHROMAX) 600 MG tablet Take 1 tablet (600 mg total) by mouth every 7 (seven) days. (Patient not taking: Reported on 02/16/2014)     Patient Active Problem List   Diagnosis Date Noted  . Human immunodeficiency virus (HIV) disease 04/23/2010  . MONILIASIS, ORAL 04/23/2010  . TOBACCO USE 04/23/2010  . SKIN LESION 04/23/2010  . CACHEXIA 04/23/2010     Health Maintenance Due  Topic Date Due  . TETANUS/TDAP  02/07/1984  . INFLUENZA VACCINE  09/11/2013     Review of Systems Review of Systems  Constitutional: Negative for fever, chills, diaphoresis, activity change, appetite change, fatigue and unexpected weight change.  HENT: Negative for congestion, sore throat, rhinorrhea, sneezing, trouble swallowing and sinus pressure.  Eyes: Negative for photophobia and visual disturbance.  Respiratory: Negative for cough, chest tightness, shortness of breath,  wheezing and stridor.  Cardiovascular: Negative for chest pain, palpitations and leg swelling.  Gastrointestinal: Negative for nausea, vomiting, abdominal pain, diarrhea, constipation, blood in stool, abdominal distention and anal bleeding.  Genitourinary: Negative for dysuria, hematuria, flank pain and difficulty urinating.  Musculoskeletal: Negative for myalgias, back pain, joint swelling, arthralgias and gait problem.  Skin: Negative for color change, pallor, rash and wound.  Neurological: Negative for dizziness, tremors, weakness and light-headedness.  Hematological: Negative for adenopathy. Does not bruise/bleed easily.  Psychiatric/Behavioral: Negative for behavioral problems, confusion, sleep disturbance, dysphoric mood, decreased concentration and agitation.    Physical Exam   BP 152/103 mmHg  Pulse 91  Temp(Src) 98.2 F (36.8 C) (Oral)  Wt 135 lb (61.236 kg) repeat BP 141/87 Physical Exam  Constitutional: He is oriented to person, place, and time. He appears well-developed and well-nourished. No distress.  HENT:  Mouth/Throat: Oropharynx is clear and moist. No oropharyngeal exudate.  Cardiovascular: Normal rate, regular rhythm and normal heart sounds. Exam reveals no gallop and no friction rub.  No murmur heard.  Pulmonary/Chest: Effort normal and breath sounds normal. No respiratory distress. He has no wheezes.  Abdominal: Soft. Bowel sounds are normal. He exhibits no distension. There is no tenderness.  Lymphadenopathy:  He has no cervical adenopathy.  Neurological: He is alert and oriented to person, place, and time.  Skin: Skin is warm and dry. No rash noted. No erythema.  Psychiatric: He has a normal mood and affect. His behavior is normal.    Lab Results  Component Value Date   CD4TCELL 3*  12/01/2012   Lab Results  Component Value Date   CD4TABS 10* 12/01/2012   CD4TABS 30* 11/19/2011   CD4TABS <10* 10/03/2011   Lab Results  Component Value Date    HIV1RNAQUANT [id number]* 12/01/2012   Lab Results  Component Value Date   HEPBSAB POS* 10/03/2011   No results found for: RPR  CBC Lab Results  Component Value Date   WBC 1.8* 12/01/2012   RBC 4.44 12/01/2012   HGB 13.9 12/01/2012   HCT 39.7 12/01/2012   PLT 180 12/01/2012   MCV 89.4 12/01/2012   MCH 31.3 12/01/2012   MCHC 35.0 12/01/2012   RDW 13.4 12/01/2012   LYMPHSABS 0.4* 12/01/2012   MONOABS 0.4 12/01/2012   EOSABS 0.3 12/01/2012   BASOSABS 0.0 12/01/2012   BMET Lab Results  Component Value Date   NA 139 12/01/2012   K 4.5 12/01/2012   CL 105 12/01/2012   CO2 28 12/01/2012   GLUCOSE 95 12/01/2012   BUN 16 12/01/2012   CREATININE 1.12 12/01/2012   CALCIUM 9.0 12/01/2012   GFRNONAA 78 12/01/2012   GFRAA >89 12/01/2012     Assessment and Plan  hiv = will check labs, continue on tivicay and truvada. If labs look good, will switch to stribild  Health maintenance =will check rpr, lipids, ua, urine gc/chlam  Pre htn = no need for htn meds at this time. Will ask to decrease salt intake  Emphasize need to keep lab and md visit  Addendum: he is not virologcially controlled. Likely not taking medications. Will add genotype nad integrase inhibitor. Anticipate to change regimen in 2 wk

## 2014-02-17 LAB — URINALYSIS
BILIRUBIN URINE: NEGATIVE
GLUCOSE, UA: NEGATIVE mg/dL
Hgb urine dipstick: NEGATIVE
Ketones, ur: NEGATIVE mg/dL
LEUKOCYTES UA: NEGATIVE
Nitrite: NEGATIVE
PH: 7 (ref 5.0–8.0)
PROTEIN: NEGATIVE mg/dL
Specific Gravity, Urine: 1.021 (ref 1.005–1.030)
Urobilinogen, UA: 1 mg/dL (ref 0.0–1.0)

## 2014-02-17 LAB — RPR

## 2014-02-17 LAB — T-HELPER CELL (CD4) - (RCID CLINIC ONLY)
CD4 T CELL HELPER: 3 % — AB (ref 33–55)
CD4 T Cell Abs: 20 /uL — ABNORMAL LOW (ref 400–2700)

## 2014-02-18 LAB — HIV-1 RNA QUANT-NO REFLEX-BLD
HIV 1 RNA Quant: 50284 copies/mL — ABNORMAL HIGH (ref <20)
HIV-1 RNA QUANT, LOG: 4.7 {Log} — AB (ref <1.30)

## 2014-02-21 ENCOUNTER — Other Ambulatory Visit: Payer: Self-pay | Admitting: Internal Medicine

## 2014-02-21 ENCOUNTER — Other Ambulatory Visit: Payer: Self-pay | Admitting: *Deleted

## 2014-02-21 DIAGNOSIS — B20 Human immunodeficiency virus [HIV] disease: Secondary | ICD-10-CM

## 2014-02-25 ENCOUNTER — Other Ambulatory Visit: Payer: Self-pay | Admitting: Internal Medicine

## 2014-02-25 DIAGNOSIS — B20 Human immunodeficiency virus [HIV] disease: Secondary | ICD-10-CM

## 2014-02-25 MED ORDER — AZITHROMYCIN 600 MG PO TABS: 1200.0000 mg | ORAL_TABLET | ORAL | Status: DC

## 2014-02-25 MED ORDER — SULFAMETHOXAZOLE-TRIMETHOPRIM 400-80 MG PO TABS: 1.0000 | ORAL_TABLET | Freq: Every day | ORAL | Status: DC

## 2014-02-25 NOTE — Progress Notes (Signed)
Spoke with patient and mentioned that his hiv regimen, tivicay/truvada is not working for him. geno pending. Will have him back on bactrim daily plus azithro weekly since cd 4 count < 50. Plan to see back in 2 wk when geno returns. He is to come to clinic for [id number] testing

## 2014-03-01 LAB — HIV-1 INTEGRASE GENOTYPE

## 2014-03-04 LAB — HIV-1 GENOTYPR PLUS

## 2014-05-04 ENCOUNTER — Encounter: Payer: Self-pay | Admitting: *Deleted

## 2014-05-04 NOTE — Progress Notes (Signed)
Patient ID: Derek Donovan, male   DOB: Sep 12, 1965, 49 y.o.   MRN: [id number] Walk-in, 1.5" X 2" raised area on L upper abdomen started this AM.  Non-tender, no erythema or pain.  Pt has an appt w/ Dr. Drue SecondSnider 05/24/14.  RN advised pt to monitor the area for changes between now and his appt w/ Dr. Drue SecondSnider.  Pt verbalized understanding.  Pt also given information about primary care MD offices to contact to establish primary care.

## 2014-05-24 ENCOUNTER — Encounter: Payer: Self-pay | Admitting: Internal Medicine

## 2014-05-24 ENCOUNTER — Ambulatory Visit (INDEPENDENT_AMBULATORY_CARE_PROVIDER_SITE_OTHER): Payer: 59 | Admitting: Internal Medicine

## 2014-05-24 VITALS — BP 138/82 | HR 80 | Temp 97.6°F | Wt 143.0 lb

## 2014-05-24 DIAGNOSIS — B20 Human immunodeficiency virus [HIV] disease: Secondary | ICD-10-CM | POA: Diagnosis not present

## 2014-05-24 DIAGNOSIS — Z21 Asymptomatic human immunodeficiency virus [HIV] infection status: Secondary | ICD-10-CM | POA: Diagnosis not present

## 2014-05-24 DIAGNOSIS — Z113 Encounter for screening for infections with a predominantly sexual mode of transmission: Secondary | ICD-10-CM | POA: Diagnosis not present

## 2014-05-24 LAB — COMPLETE METABOLIC PANEL WITH GFR
ALBUMIN: 3.9 g/dL (ref 3.5–5.2)
ALT: 17 U/L (ref 0–53)
AST: 22 U/L (ref 0–37)
Alkaline Phosphatase: 69 U/L (ref 39–117)
BUN: 15 mg/dL (ref 6–23)
CO2: 27 mEq/L (ref 19–32)
Calcium: 8.3 mg/dL — ABNORMAL LOW (ref 8.4–10.5)
Chloride: 103 mEq/L (ref 96–112)
Creat: 1.08 mg/dL (ref 0.50–1.35)
GFR, Est African American: >89 mL/min
GFR, Est Non African American: 80 mL/min
Glucose, Bld: 86 mg/dL (ref 70–99)
POTASSIUM: 4 meq/L (ref 3.5–5.3)
SODIUM: 136 meq/L (ref 135–145)
TOTAL PROTEIN: 7.4 g/dL (ref 6.0–8.3)
Total Bilirubin: 0.4 mg/dL (ref 0.2–1.2)

## 2014-05-24 LAB — CBC WITH DIFFERENTIAL/PLATELET
Basophils Absolute: 0 10*3/uL (ref 0.0–0.1)
Basophils Relative: 1 % (ref 0–1)
Eosinophils Absolute: 0.7 10*3/uL (ref 0.0–0.7)
Eosinophils Relative: 19 % — ABNORMAL HIGH (ref 0–5)
HCT: 42.4 % (ref 39.0–52.0)
Hemoglobin: 14.5 g/dL (ref 13.0–17.0)
Lymphocytes Relative: 18 % (ref 12–46)
Lymphs Abs: 0.6 10*3/uL — ABNORMAL LOW (ref 0.7–4.0)
MCH: 28.9 pg (ref 26.0–34.0)
MCHC: 34.2 g/dL (ref 30.0–36.0)
MCV: 84.5 fL (ref 78.0–100.0)
MONO ABS: 0.6 10*3/uL (ref 0.1–1.0)
MPV: 9.4 fL (ref 8.6–12.4)
Monocytes Relative: 16 % — ABNORMAL HIGH (ref 3–12)
Neutro Abs: 1.6 10*3/uL — ABNORMAL LOW (ref 1.7–7.7)
Neutrophils Relative %: 46 % (ref 43–77)
PLATELETS: 174 10*3/uL (ref 150–400)
RBC: 5.02 MIL/uL (ref 4.22–5.81)
RDW: 13.6 % (ref 11.5–15.5)
WBC: 3.5 10*3/uL — ABNORMAL LOW (ref 4.0–10.5)

## 2014-05-24 MED ORDER — SULFAMETHOXAZOLE-TRIMETHOPRIM 400-80 MG PO TABS: 1.0000 | ORAL_TABLET | Freq: Every day | ORAL | Status: DC

## 2014-05-24 MED ORDER — ELVITEG-COBIC-EMTRICIT-TENOFAF 150-150-200-10 MG PO TABS: 1.0000 | ORAL_TABLET | Freq: Every day | ORAL | Status: DC

## 2014-05-24 MED ORDER — AZITHROMYCIN 600 MG PO TABS
1200.0000 mg | ORAL_TABLET | ORAL | Status: DC
Start: 2014-05-24 — End: 2019-10-25

## 2014-05-24 MED ORDER — DARUNAVIR ETHANOLATE 800 MG PO TABS: 800.0000 mg | ORAL_TABLET | Freq: Every day | ORAL | Status: DC

## 2014-05-24 NOTE — Progress Notes (Signed)
Subjective:    Patient ID: Derek Donovan, male    DOB: 08/30/1965, 49 y.o.   MRN: [id number]  HPI Derek Donovan is a 49 y/o male who presents to the clinic for advanced HIV disease follow up. In January his CD4 count was 20 and VL of 50,284. Genotype reveals resistance to 3TC and FTC.  He states he has been taking his Truvada every day. However, it seems as though he is unsure of how he takes his medications. He was getting confused about his Tivicay stating he was taking this once a week. When I discussed with him that this medication should be taken every day, he then said he did take this every day but there was another pill he took once a week referring to his Azithromycin. We went through his medications and discussed that his HIV meds must be taken every single day. He denies that he had any side effects with the Bactrim, azithromycin or fluconazole. He states he stopped taking them R/T cost. He does have insurance through [employer] now. He is also c/o a small mass to his left ribcage on the midclavicular line. He states it has not changed in size nor does this hurt.   Social history: Smoking 1ppd. Denies any sexual partners since his last visit.  Works Public affairs consultantenvironmental services at [employer]  Meds: reviewed  Active Ambulatory Problems    Diagnosis Date Noted  . Human immunodeficiency virus (HIV) disease 04/23/2010  . MONILIASIS, ORAL 04/23/2010  . TOBACCO USE 04/23/2010  . SKIN LESION 04/23/2010  . CACHEXIA 04/23/2010   Resolved Ambulatory Problems    Diagnosis Date Noted  . No Resolved Ambulatory Problems   Past Medical History  Diagnosis Date  . HIV infection      Review of Systems  Constitutional: Negative for fever, chills, appetite change and unexpected weight change.  HENT: Negative for mouth sores and sore throat.   Eyes: Negative for visual disturbance.  Respiratory: Negative for cough, shortness of breath and wheezing.   Cardiovascular: Negative for chest pain.    Gastrointestinal: Negative for nausea, abdominal pain, diarrhea, constipation and abdominal distention.  Endocrine: Negative.   Genitourinary: Negative for difficulty urinating.  Musculoskeletal: Negative.   Skin: Negative for rash.  Allergic/Immunologic: Negative.   Neurological: Negative for weakness, light-headedness and headaches.  Hematological: Negative for adenopathy.  Psychiatric/Behavioral: Negative for confusion and sleep disturbance.       Objective:   Physical Exam  Constitutional: He is oriented to person, place, and time. He appears well-developed and well-nourished. No distress.  HENT:  Head: Normocephalic and atraumatic.  Eyes: Pupils are equal, round, and reactive to light.  Neck: Normal range of motion. Neck supple.  Cardiovascular: Normal rate, regular rhythm and normal heart sounds.   Pulmonary/Chest: Effort normal and breath sounds normal.  Abdominal: Soft. Bowel sounds are normal.  Superficial soft, non-tender mass in subcutaneous tissue over left rib cage MCL  Musculoskeletal: Normal range of motion.  Lymphadenopathy:    He has no cervical adenopathy.  Neurological: He is alert and oriented to person, place, and time.  Skin: Skin is warm and dry.  Psychiatric: He has a normal mood and affect. His behavior is normal.  BP 138/82 mmHg  Pulse 80  Temp(Src) 97.6 F (36.4 C) (Oral)  Wt 143 lb (64.864 kg)   Lab Results  Component Value Date   HIV1RNAQUANT 50284* 02/16/2014   Lab Results  Component Value Date   CD4TABS 20* 02/16/2014   CD4TABS  10* 12/01/2012   CD4TABS 30* 11/19/2011   Lab Results  Component Value Date   CREATININE 1.07 02/16/2014   CREATININE 1.12 12/01/2012   CREATININE 1.33 11/19/2011          Assessment & Plan:  Advanced HIV disease : poorly controlled. We will plan to switch to [facility] + Darunavir Check labs today and in four to six weeks  Restart OI pphx  Adherence counseling = spent 15 min in face to face time, on  importance of adherence  Presumed lipoma = appears non tender, no surrounding erythema or tenderness, we will monitor mass on left rib cage    I have interviewed, examined the patient, discussed plan as outlined by Macy Mis, MD

## 2014-05-24 NOTE — Progress Notes (Addendum)
Patient ID: Derek Donovan, male   DOB: [birth year], 49 y.o.   MRN: [id number] HPI: Derek Donovan is a 49 y.o. male who is here for his HIV f/u.   Allergies: Allergies  Allergen Reactions  . Epinephrine Shortness Of Breath    Vitals: Temp: 97.6 F (36.4 C) (04/12 1052) Temp Source: Oral (04/12 1052) BP: 138/82 mmHg (04/12 1052) Pulse Rate: 80 (04/12 1052)  Past Medical History: Past Medical History  Diagnosis Date  . HIV infection     Social History: History   Social History  . Marital Status: Single    Spouse Name: N/A  . Number of Children: N/A  . Years of Education: N/A   Social History Main Topics  . Smoking status: Current Every Day Smoker -- 0.50 packs/day for 10 years  . Smokeless tobacco: Never Used  . Alcohol Use: 0.6 oz/week    1 Cans of beer per week  . Drug Use: 7.00 per week    Special: Marijuana  . Sexual Activity:    Partners: Male     Comment: given condoms   Other Topics Concern  . None   Social History Narrative    Previous Regimen:   Current Regimen: Tivicay + Truvada  Labs: HIV 1 RNA QUANT (copies/mL)  Date Value  02/16/2014 50284*  12/01/2012 [id number]*  11/19/2011 136*   CD4 T CELL ABS  Date Value  02/16/2014 20 /uL*  12/01/2012 10 /uL*  11/19/2011 30 cmm*   HEP B S AB (no units)  Date Value  10/03/2011 POS*   HEPATITIS B SURFACE AG (no units)  Date Value  10/03/2011 NEGATIVE   HCV AB (no units)  Date Value  10/03/2011 NEGATIVE    CrCl: CrCl cannot be calculated (Unknown ideal weight.).  Lipids:    Component Value Date/Time   CHOL 145 02/16/2014 1556   TRIG 152* 02/16/2014 1556   HDL 34* 02/16/2014 1556   CHOLHDL 4.3 02/16/2014 1556   VLDL 30 02/16/2014 1556   LDLCALC 81 02/16/2014 1556    Assessment: 49 yo who is here for his HIV f/u. His VL was elevated in January of this yr. He has m184v mutation. He has had some confusion about his meds and how to take it, therefore his compliance has not been the  greatest. We are going to change his regimen to Genvoya + DRV so we would have a PI in there. Made him a new calendar and explained the new regimen to him.   Recommendations: Stop DTG/Truvaday Start Genvoya + DRV   Clide CliffPham, Akya Fiorello Quang, PharmD Clinical Infectious Disease Pharmacist [facility] 05/24/2014, 2:17 PM

## 2014-05-25 LAB — T-HELPER CELL (CD4) - (RCID CLINIC ONLY)
CD4 % Helper T Cell: 2 % — ABNORMAL LOW (ref 33–55)
CD4 T Cell Abs: 10 /uL — ABNORMAL LOW (ref 400–2700)

## 2014-05-25 LAB — RPR

## 2014-05-25 LAB — HIV-1 RNA QUANT-NO REFLEX-BLD
HIV 1 RNA QUANT: 106135 {copies}/mL — AB (ref <20)
HIV-1 RNA Quant, Log: 5.03 {Log} — ABNORMAL HIGH (ref <1.30)

## 2014-06-03 ENCOUNTER — Ambulatory Visit: Payer: 59 | Admitting: Family

## 2014-06-21 ENCOUNTER — Ambulatory Visit: Payer: 59 | Admitting: Family

## 2014-07-06 ENCOUNTER — Ambulatory Visit: Payer: 59 | Admitting: Internal Medicine

## 2014-10-04 ENCOUNTER — Telehealth: Payer: Self-pay | Admitting: Licensed Clinical Social Worker

## 2014-10-04 NOTE — Telephone Encounter (Signed)
Ok thank you 

## 2014-10-04 NOTE — Telephone Encounter (Signed)
Elizabeth pharmacist from [facility] called stating that there is an interaction that shows between [place]. States that Prezista can effect pharm akinetic levels of the Genvoya.

## 2014-10-04 NOTE — Telephone Encounter (Signed)
Its ok. Tell them to fill it

## 2014-11-28 ENCOUNTER — Other Ambulatory Visit: Payer: 59

## 2014-11-28 DIAGNOSIS — B20 Human immunodeficiency virus [HIV] disease: Secondary | ICD-10-CM

## 2014-11-30 LAB — T-HELPER CELL (CD4) - (RCID CLINIC ONLY)
CD4 % Helper T Cell: 7 % — ABNORMAL LOW (ref 33–55)
CD4 T Cell Abs: 90 /uL — ABNORMAL LOW (ref 400–2700)

## 2014-12-01 LAB — HIV-1 RNA ULTRAQUANT REFLEX TO GENTYP+
HIV 1 RNA Quant: 59172 copies/mL — ABNORMAL HIGH (ref <20)
HIV-1 RNA Quant, Log: 4.77 Log copies/mL — ABNORMAL HIGH (ref <1.30)

## 2014-12-08 LAB — HIV-1 GENOTYPR PLUS

## 2014-12-12 ENCOUNTER — Ambulatory Visit: Payer: 59 | Admitting: Internal Medicine

## 2014-12-27 ENCOUNTER — Encounter: Payer: Self-pay | Admitting: Internal Medicine

## 2014-12-27 ENCOUNTER — Ambulatory Visit (INDEPENDENT_AMBULATORY_CARE_PROVIDER_SITE_OTHER): Payer: 59 | Admitting: Internal Medicine

## 2014-12-27 VITALS — BP 133/83 | HR 92 | Temp 97.7°F | Wt 133.0 lb

## 2014-12-27 DIAGNOSIS — F329 Major depressive disorder, single episode, unspecified: Secondary | ICD-10-CM

## 2014-12-27 DIAGNOSIS — B20 Human immunodeficiency virus [HIV] disease: Secondary | ICD-10-CM

## 2014-12-27 DIAGNOSIS — A539 Syphilis, unspecified: Secondary | ICD-10-CM

## 2014-12-27 DIAGNOSIS — H109 Unspecified conjunctivitis: Secondary | ICD-10-CM | POA: Diagnosis not present

## 2014-12-27 DIAGNOSIS — F32A Depression, unspecified: Secondary | ICD-10-CM

## 2014-12-27 MED ORDER — ELVITEG-COBIC-EMTRICIT-TENOFAF 150-150-200-10 MG PO TABS: 1.0000 | ORAL_TABLET | Freq: Every day | ORAL | Status: DC

## 2014-12-27 MED ORDER — DARUNAVIR ETHANOLATE 800 MG PO TABS: 800.0000 mg | ORAL_TABLET | Freq: Every day | ORAL | Status: DC

## 2014-12-27 MED ORDER — ZOLOFT 50 MG PO TABS: 50.0000 mg | ORAL_TABLET | Freq: Every day | ORAL | Status: DC

## 2014-12-27 MED ORDER — POLYETHYL GLYCOL-PROPYL GLYCOL 0.4-0.3 % OP SOLN: 1.0000 [drp] | Freq: Two times a day (BID) | OPHTHALMIC | Status: DC

## 2014-12-27 NOTE — Progress Notes (Signed)
Patient ID: Derek Donovan, male   DOB: 01-29-1966, 49 y.o.   MRN: [id number]       Patient ID: Derek Donovan, male   DOB: 01-29-1966, 49 y.o.   MRN: [id number]  HPI 49yo M with HIV disease, Cd 4 count of 90/ VL 59,000, geno shows M184V,T69D, currently on genvoya-DRV plus bactrim and azithro prophylaxis. He reports having depression. He misses work due to feeling poorly though he does not call us for evaluation. He reports taking his meds routine to me,but when prompted by pharmacy, he reports intermittent adherence.  Initially he was to return to clinic in May but has not done so until now.  He notices that his palms nad soles of feet are exfoliating, worsening irritation to nail beds. 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 He denies blurry vision  Pharmacy revealed that he last picked up his ART in August  Outpatient Encounter Prescriptions as of 12/27/2014  Medication Sig  . azithromycin (ZITHROMAX) 600 MG tablet Take 2 tablets (1,200 mg total) by mouth once a week.  . Darunavir Ethanolate (PREZISTA) 800 MG tablet Take 1 tablet (800 mg total) by mouth daily with breakfast.  . elvitegravir-cobicistat-emtricitabine-tenofovir (GENVOYA) 150-150-200-10 MG TABS tablet Take 1 tablet by mouth daily with breakfast.  . sulfamethoxazole-trimethoprim (BACTRIM) 400-80 MG per tablet Take 1 tablet by mouth daily.   No facility-administered encounter medications on file as of 12/27/2014.     Patient Active Problem List   Diagnosis Date Noted  . Human immunodeficiency virus (HIV) disease (HCC) 04/23/2010  . MONILIASIS, ORAL 04/23/2010  . TOBACCO USE 04/23/2010  . SKIN LESION 04/23/2010  . CACHEXIA 04/23/2010     Health Maintenance Due  Topic Date Due  . TETANUS/TDAP  02/07/1984  . INFLUENZA VACCINE  09/12/2014     Review of Systems +rash to palms and soles of feet, depression, conjunctivitis occasionally. No suicide  ideation. 10 point ros is otherwise negative Physical Exam   BP 133/83 mmHg  Pulse 92  Temp(Src) 97.7 F (36.5 C) (Oral)  Wt 133 lb (60.328 kg) Physical Exam  Constitutional: He is oriented to person, place, and time. He appears well-developed and well-nourished. No distress.  HENT: geographic tonge Mouth/Throat: Oropharynx is clear and moist. No oropharyngeal exudate.  Cardiovascular: Normal rate, regular rhythm and normal heart sounds. Exam reveals no gallop and no friction rub.  No murmur heard.  Pulmonary/Chest: Effort normal and breath sounds normal. No respiratory distress. He has no wheezes.  Abdominal: Soft. Bowel sounds are normal. He exhibits no distension. There is no tenderness.  Lymphadenopathy:  He has no cervical adenopathy.  Neurological: He is alert and oriented to person, place, and time.  Skin: Skin on his hands are exfoliating. Psychiatric: He has a normal mood and affect. His behavior is normal.    Lab Results  Component Value Date   CD4TCELL 7* 11/28/2014   Lab Results  Component Value Date   CD4TABS 90* 11/28/2014   CD4TABS 10* 05/24/2014   CD4TABS 20* 02/16/2014   Lab Results  Component Value Date   HIV1RNAQUANT 59172* 11/28/2014   Lab Results  Component Value Date   HEPBSAB POS* 10/03/2011   No results found for: RPR  CBC Lab Results  Component Value Date   WBC 3.5* 05/24/2014   RBC 5.02 05/24/2014   HGB 14.5 05/24/2014   HCT 42.4 05/24/2014   PLT 174 05/24/2014   MCV 84.5 05/24/2014   MCH  28.9 05/24/2014   MCHC 34.2 05/24/2014   RDW 13.6 05/24/2014   LYMPHSABS 0.6* 05/24/2014   MONOABS 0.6 05/24/2014   EOSABS 0.7 05/24/2014   BASOSABS 0.0 05/24/2014   BMET Lab Results  Component Value Date   NA 136 05/24/2014   K 4.0 05/24/2014   CL 103 05/24/2014   CO2 27 05/24/2014   GLUCOSE 86 05/24/2014   BUN 15 05/24/2014   CREATININE 1.08 05/24/2014   CALCIUM 8.3* 05/24/2014   GFRNONAA 80 05/24/2014   GFRAA >89 05/24/2014      Assessment and Plan  hiv disease= poorly controlled. Still strong suspicion that he is not taking his medication. Will do adherence counseling. Will check hiv viral load plus integrase inhibitor, and resistance testing  oi proph = continue on both azithromycin weekly and bactrim ss daily  Palmar rash = possibly ezcema but concern for secondary syphilis. Will check RPR  Depression = will have him schedule appt with [facility] but also want to try zoloft  daily  -------------------- Addendum REACTIVE (11/15 1554)  Will need to have him get ruled out for neurosyphilis. Will likely need 2 wk of IV PCN, RPR titer is impressive at 1:4076

## 2014-12-27 NOTE — Progress Notes (Signed)
  [facility] - Pharmacist    HPI: Derek Donovan is a 49 y.o. male here for follow-up.  Allergies: Allergies  Allergen Reactions  . Epinephrine Shortness Of Breath    Vitals: Temp: 97.7 F (36.5 C) (11/15 1437) Temp Source: Oral (11/15 1437) BP: 133/83 mmHg (11/15 1437) Pulse Rate: 92 (11/15 1437)  Past Medical History: Past Medical History  Diagnosis Date  . HIV infection [facility](HCC)     Social History: Social History   Social History  . Marital Status: Single    Spouse Name: N/A  . Number of Children: N/A  . Years of Education: N/A   Social History Main Topics  . Smoking status: Current Every Day Smoker -- 0.50 packs/day for 10 years  . Smokeless tobacco: Never Used  . Alcohol Use: 0.6 oz/week    1 Cans of beer per week  . Drug Use: No  . Sexual Activity:    Partners: Female, Male     Comment: given condoms   Other Topics Concern  . None   Social History Narrative    Current Regimen: Genvoya + Prezista  Labs: HIV 1 RNA QUANT (copies/mL)  Date Value  11/28/2014 59172*  05/24/2014 [id number]*  02/16/2014 [id number]*   CD4 T CELL ABS (/uL)  Date Value  11/28/2014 90*  05/24/2014 10*  02/16/2014 20*   HEP B S AB (no units)  Date Value  10/03/2011 POS*   HEPATITIS B SURFACE AG (no units)  Date Value  10/03/2011 NEGATIVE   HCV AB (no units)  Date Value  10/03/2011 NEGATIVE     CrCl: CrCl cannot be calculated (Unknown ideal weight.).  Lipids:    Component Value Date/Time   CHOL 145 02/16/2014 1556   TRIG 152* 02/16/2014 1556   HDL 34* 02/16/2014 1556   CHOLHDL 4.3 02/16/2014 1556   VLDL 30 02/16/2014 1556   LDLCALC 81 02/16/2014 1556    Assessment: Suboptimal adherence contributing to uncontrolled HIV. He admits to missing some medication doses.  Per pharmacy fill records he has not filled his medication since August 2016.  He is interested in being better controlled to improve his overall well being and his  skin.  Recommendations: Medication adherence counseling performed. Gave patient a weekly pill box as well as a medication key chain. Discussed strategies for compliance. Also provided him with the [facility] so he can call for refills and our pharmacy number [facility] at [facility].   Sallee Provencalurner, Meganne Rita S, Pharm.D., BCPS, AAHIVP Clinical Infectious Disease Pharmacist [facility] 12/27/2014, 3:17 PM

## 2014-12-28 LAB — RPR: RPR Ser Ql: REACTIVE — AB

## 2014-12-28 LAB — FLUORESCENT TREPONEMAL AB(FTA)-IGG-BLD: FLUORESCENT TREPONEMAL ABS: REACTIVE — AB

## 2014-12-28 LAB — RPR TITER

## 2014-12-28 MED ORDER — SERTRALINE HCL 50 MG PO TABS: 50.0000 mg | ORAL_TABLET | Freq: Every day | ORAL | Status: DC

## 2014-12-29 ENCOUNTER — Other Ambulatory Visit: Payer: Self-pay | Admitting: Internal Medicine

## 2014-12-29 ENCOUNTER — Other Ambulatory Visit (HOSPITAL_COMMUNITY): Payer: Self-pay | Admitting: *Deleted

## 2014-12-29 ENCOUNTER — Telehealth: Payer: Self-pay | Admitting: *Deleted

## 2014-12-29 DIAGNOSIS — A539 Syphilis, unspecified: Secondary | ICD-10-CM

## 2014-12-29 NOTE — Telephone Encounter (Signed)
-----   Message from Judyann Munsonynthia Snider, MD sent at 12/28/2014 12:27 PM EST ----- So his RPR is through the rough. Can you help me get a hold of him by cell so that a) he knows that he has syphilis b) his rash on his hands and feet are likely due to syphilis c) we need to arrange for [facility] and IR to do LP so that we can see if need to treat for neurosyphilis.

## 2014-12-29 NOTE — Telephone Encounter (Addendum)
Patient notified [facility]15 at 7:30 AM. Nothing to eat or drink after midnight and he knows he needs a driver. They will do the CT prior to the LP. Per [employer] insurance the CT does not need prior authorization or precert. Wendall MolaJacqueline Nyair Depaulo

## 2014-12-29 NOTE — Telephone Encounter (Signed)
[facility]. Had to leave her a voice mail. Wendall MolaJacqueline Cockerham

## 2014-12-30 LAB — HIV-1 RNA ULTRAQUANT REFLEX TO GENTYP+
HIV 1 RNA QUANT: 10816 {copies}/mL — AB (ref <20)
HIV-1 RNA QUANT, LOG: 4.03 {Log_copies}/mL — AB (ref <1.30)

## 2015-01-04 ENCOUNTER — Ambulatory Visit (HOSPITAL_COMMUNITY): Admission: RE | Admit: 2015-01-04 | Payer: 59 | Source: Ambulatory Visit

## 2015-01-04 ENCOUNTER — Ambulatory Visit (HOSPITAL_COMMUNITY): Payer: 59 | Attending: Internal Medicine

## 2015-01-04 LAB — HIV-1 INTEGRASE GENOTYPE

## 2015-01-09 LAB — HIV-1 GENOTYPR PLUS

## 2015-01-12 ENCOUNTER — Telehealth: Payer: Self-pay | Admitting: *Deleted

## 2015-01-12 NOTE — Telephone Encounter (Signed)
-----   Message from Judyann Munsonynthia Snider, MD sent at 01/11/2015  4:31 PM EST ----- Can you call him to see why he missed his procedure? Lean on him a bit about hte seriousness since the state will start tracking him down. Alternatively we can just get a piccline and start IV PCN x 2 wk

## 2015-01-12 NOTE — Telephone Encounter (Signed)
Called patient and gave him the number to radiology [phone] to reschedule the lumbar puncture. He said he would do this. Will check back to make sure the appointment was scheduled. Derek MolaJacqueline Altonio Schwertner

## 2015-01-17 ENCOUNTER — Ambulatory Visit (HOSPITAL_COMMUNITY)
Admission: RE | Admit: 2015-01-17 | Discharge: 2015-01-17 | Disposition: A | Discharge status: Home / Self Care | Payer: 59 | Source: Ambulatory Visit | Attending: Internal Medicine | Admitting: Internal Medicine

## 2015-01-17 ENCOUNTER — Ambulatory Visit: Payer: 59

## 2015-01-17 DIAGNOSIS — B20 Human immunodeficiency virus [HIV] disease: Secondary | ICD-10-CM | POA: Diagnosis not present

## 2015-01-17 DIAGNOSIS — A539 Syphilis, unspecified: Secondary | ICD-10-CM | POA: Diagnosis present

## 2015-01-23 ENCOUNTER — Ambulatory Visit (INDEPENDENT_AMBULATORY_CARE_PROVIDER_SITE_OTHER): Payer: 59 | Admitting: Internal Medicine

## 2015-01-23 ENCOUNTER — Inpatient Hospital Stay (HOSPITAL_COMMUNITY)
Admission: AD | Admit: 2015-01-23 | Discharge: 2015-01-26 | DRG: 056 | Disposition: A | Discharge status: Home Health | Payer: 59 | Source: Ambulatory Visit | Attending: Internal Medicine | Admitting: Internal Medicine

## 2015-01-23 ENCOUNTER — Encounter: Payer: Self-pay | Admitting: Internal Medicine

## 2015-01-23 ENCOUNTER — Encounter (HOSPITAL_COMMUNITY): Payer: Self-pay | Admitting: *Deleted

## 2015-01-23 VITALS — BP 121/79 | HR 77 | Temp 97.3°F | Wt 134.0 lb

## 2015-01-23 DIAGNOSIS — H109 Unspecified conjunctivitis: Secondary | ICD-10-CM | POA: Diagnosis not present

## 2015-01-23 DIAGNOSIS — H10022 Other mucopurulent conjunctivitis, left eye: Secondary | ICD-10-CM | POA: Diagnosis present

## 2015-01-23 DIAGNOSIS — A523 Neurosyphilis, unspecified: Principal | ICD-10-CM | POA: Diagnosis present

## 2015-01-23 DIAGNOSIS — F329 Major depressive disorder, single episode, unspecified: Secondary | ICD-10-CM | POA: Diagnosis present

## 2015-01-23 DIAGNOSIS — Z79899 Other long term (current) drug therapy: Secondary | ICD-10-CM | POA: Diagnosis not present

## 2015-01-23 DIAGNOSIS — R21 Rash and other nonspecific skin eruption: Secondary | ICD-10-CM | POA: Diagnosis present

## 2015-01-23 DIAGNOSIS — Z8249 Family history of ischemic heart disease and other diseases of the circulatory system: Secondary | ICD-10-CM

## 2015-01-23 DIAGNOSIS — A5149 Other secondary syphilitic conditions: Secondary | ICD-10-CM | POA: Diagnosis present

## 2015-01-23 DIAGNOSIS — A539 Syphilis, unspecified: Secondary | ICD-10-CM

## 2015-01-23 DIAGNOSIS — F17200 Nicotine dependence, unspecified, uncomplicated: Secondary | ICD-10-CM | POA: Diagnosis present

## 2015-01-23 DIAGNOSIS — B20 Human immunodeficiency virus [HIV] disease: Secondary | ICD-10-CM

## 2015-01-23 DIAGNOSIS — Z888 Allergy status to other drugs, medicaments and biological substances status: Secondary | ICD-10-CM

## 2015-01-23 DIAGNOSIS — R509 Fever, unspecified: Secondary | ICD-10-CM

## 2015-01-23 HISTORY — DX: Human immunodeficiency virus (HIV) disease: B20

## 2015-01-23 LAB — PROTIME-INR
INR: 1.09 (ref 0.00–1.49)
PROTHROMBIN TIME: 14.3 s (ref 11.6–15.2)

## 2015-01-23 LAB — TSH: TSH: 0.314 u[IU]/mL — ABNORMAL LOW (ref 0.350–4.500)

## 2015-01-23 MED ORDER — MORPHINE SULFATE (PF) 2 MG/ML IV SOLN: 1.0000 mg | INTRAVENOUS | Status: DC | PRN

## 2015-01-23 MED ORDER — AZITHROMYCIN 600 MG PO TABS
1200.0000 mg | ORAL_TABLET | ORAL | Status: DC
  Administered 2015-01-26: 1200 mg via ORAL
  Filled 2015-01-23: qty 2

## 2015-01-23 MED ORDER — ONDANSETRON HCL 4 MG PO TABS: 4.0000 mg | ORAL_TABLET | Freq: Four times a day (QID) | ORAL | Status: DC | PRN

## 2015-01-23 MED ORDER — ELVITEG-COBIC-EMTRICIT-TENOFAF 150-150-200-10 MG PO TABS
1.0000 | ORAL_TABLET | Freq: Every day | ORAL | Status: DC
Start: 2015-01-24 — End: 2015-01-26
  Administered 2015-01-24 – 2015-01-26 (×3): 1 via ORAL
  Filled 2015-01-23 (×4): qty 1

## 2015-01-23 MED ORDER — HYDROCODONE-ACETAMINOPHEN 5-325 MG PO TABS: 1.0000 | ORAL_TABLET | ORAL | Status: DC | PRN

## 2015-01-23 MED ORDER — SERTRALINE HCL 50 MG PO TABS
50.0000 mg | ORAL_TABLET | Freq: Every day | ORAL | Status: DC
  Administered 2015-01-24 – 2015-01-26 (×3): 50 mg via ORAL
  Filled 2015-01-23 (×3): qty 1

## 2015-01-23 MED ORDER — ONDANSETRON HCL 4 MG/2ML IJ SOLN: 4.0000 mg | Freq: Four times a day (QID) | INTRAMUSCULAR | Status: DC | PRN

## 2015-01-23 MED ORDER — SULFAMETHOXAZOLE-TRIMETHOPRIM 400-80 MG PO TABS
1.0000 | ORAL_TABLET | Freq: Every day | ORAL | Status: DC
  Administered 2015-01-24 – 2015-01-26 (×3): 1 via ORAL
  Filled 2015-01-23 (×3): qty 1

## 2015-01-23 MED ORDER — POLYVINYL ALCOHOL 1.4 % OP SOLN
1.0000 [drp] | Freq: Two times a day (BID) | OPHTHALMIC | Status: DC
  Administered 2015-01-23 – 2015-01-26 (×6): 1 [drp] via OPHTHALMIC
  Filled 2015-01-23: qty 15

## 2015-01-23 MED ORDER — ACETAMINOPHEN 325 MG PO TABS
650.0000 mg | ORAL_TABLET | Freq: Four times a day (QID) | ORAL | Status: DC | PRN
  Administered 2015-01-24: 650 mg via ORAL
  Filled 2015-01-23: qty 2

## 2015-01-23 MED ORDER — ACETAMINOPHEN 650 MG RE SUPP: 650.0000 mg | Freq: Four times a day (QID) | RECTAL | Status: DC | PRN

## 2015-01-23 MED ORDER — SODIUM CHLORIDE 0.9 % IV SOLN
INTRAVENOUS | Status: DC
  Administered 2015-01-23 – 2015-01-26 (×3): via INTRAVENOUS

## 2015-01-23 MED ORDER — DARUNAVIR ETHANOLATE 800 MG PO TABS
800.0000 mg | ORAL_TABLET | Freq: Every day | ORAL | Status: DC
  Administered 2015-01-24 – 2015-01-26 (×3): 800 mg via ORAL
  Filled 2015-01-23 (×4): qty 1

## 2015-01-23 NOTE — H&P (Signed)
[facility] History and Physical  Derek Natalric T Knarr NUU:[id number] DOB: [birth year] DOA: 01/23/2015  Referring physician: ID clinic PCP: Judyann MunsonSNIDER, CYNTHIA, MD   Chief Complaint: Left pink eye  HPI: Derek Donovan is a 49 y.o. male with past regular history of HIV CD4 count of 90, 1 to his PCP complaining about left pink eye today. Patient reported about 3 weeks ago he started to have body rash, discoid mating rash involving his palms and soles, his RPR tested positive with impressively high titer (RPR tested negative several times before). Today patient had a left pinkeye so he rushed to his PCP who referred him to the hospital for evaluation of neurosyphilis. Patient denies any blurred vision, any pain in the eye, denies any headache, neck or back pain. ID to evaluate in the morning, he might need ophthalmologist.  Review of Systems:  Constitutional: negative for anorexia, fevers and sweats Eyes:  Left pink eye Ears, nose, mouth, throat, and face: negative for earaches, epistaxis, nasal congestion and sore throat Respiratory: negative for cough, dyspnea on exertion, sputum and wheezing Cardiovascular: negative for chest pain, dyspnea, lower extremity edema, orthopnea, palpitations and syncope Gastrointestinal: negative for abdominal pain, constipation, diarrhea, melena, nausea and vomiting Genitourinary:negative for dysuria, frequency and hematuria Hematologic/lymphatic: negative for bleeding, easy bruising and lymphadenopathy Musculoskeletal:negative for arthralgias, muscle weakness and stiff joints Neurological: negative for coordination problems, gait problems, headaches and weakness Endocrine: negative for diabetic symptoms including polydipsia, polyuria and weight loss Allergic/Immunologic: negative for anaphylaxis, hay fever and urticaria  Past Medical History  Diagnosis Date  . HIV infection (HCC)   . HIV (human immunodeficiency virus infection) (HCC)    History reviewed. No  pertinent past surgical history. Social History:   reports that he has been smoking.  He has never used smokeless tobacco. He reports that he drinks about 0.6 oz of alcohol per week. He reports that he uses illicit drugs ( and Marijuana).  Allergies  Allergen Reactions  . Epinephrine Shortness Of Breath    Family History  Problem Relation Age of Onset  . Hypertension Mother      Prior to Admission medications   Medication Sig Start Date End Date Taking? Authorizing Provider  azithromycin (ZITHROMAX) 600 MG tablet Take 2 tablets (1,200 mg total) by mouth once a week. 05/24/14   Judyann Munsonynthia Snider, MD  Darunavir Ethanolate (PREZISTA) 800 MG tablet Take 1 tablet (800 mg total) by mouth daily with breakfast. 12/27/14   Judyann Munsonynthia Snider, MD  elvitegravir-cobicistat-emtricitabine-tenofovir (GENVOYA) 150-150-200-10 MG TABS tablet Take 1 tablet by mouth daily with breakfast. 12/27/14   Judyann Munsonynthia Snider, MD  Polyethyl Glycol-Propyl Glycol (LUBRICANT EYE DROPS) 0.4-0.3 % SOLN Apply 1 drop to eye 2 (two) times daily. 12/27/14   Judyann Munsonynthia Snider, MD  sertraline (ZOLOFT) 50 MG tablet Take 1 tablet (50 mg total) by mouth daily. 12/28/14   Judyann Munsonynthia Snider, MD  sulfamethoxazole-trimethoprim (BACTRIM) 400-80 MG per tablet Take 1 tablet by mouth daily. 05/24/14   Judyann Munsonynthia Snider, MD   Physical Exam: Filed Vitals:   01/23/15 1747  BP: 125/67  Pulse: 71  Temp: 97.9 F (36.6 C)  Resp: 19   Constitutional: Oriented to person, place, and time. Well-developed and well-nourished. Cooperative.  Head: Normocephalic and atraumatic.  Nose: Nose normal.  Mouth/Throat: Uvula is midline, oropharynx is clear and moist and mucous membranes are normal.  Eyes: Conjunctivae and EOM are normal. Pupils are equal, round, and reactive to light.  Neck: Trachea normal and normal range of motion. Neck supple.  Cardiovascular:  Normal rate, regular rhythm, S1 normal, S2 normal, normal heart sounds and intact distal pulses.     Pulmonary/Chest: Effort normal and breath sounds normal.  Abdominal: Soft. Bowel sounds are normal. There is no hepatosplenomegaly. There is no tenderness.  Musculoskeletal: Normal range of motion.  Neurological: Alert and oriented to person, place, and time. Has normal strength. No cranial nerve deficit or sensory deficit.  Skin: Skin is warm, dry and intact.  Psychiatric: Has a normal mood and affect. Speech is normal and behavior is normal.   Labs on Admission:  Basic Metabolic Panel: No results for input(s): NA, K, CL, CO2, GLUCOSE, BUN, CREATININE, CALCIUM, MG, PHOS in the last 168 hours. Liver Function Tests: No results for input(s): AST, ALT, ALKPHOS, BILITOT, PROT, ALBUMIN in the last 168 hours. No results for input(s): LIPASE, AMYLASE in the last 168 hours. No results for input(s): AMMONIA in the last 168 hours. CBC: No results for input(s): WBC, NEUTROABS, HGB, HCT, MCV, PLT in the last 168 hours. Cardiac Enzymes: No results for input(s): CKTOTAL, CKMB, CKMBINDEX, TROPONINI in the last 168 hours.  BNP (last 3 results) No results for input(s): BNP in the last 8760 hours.  ProBNP (last 3 results) No results for input(s): PROBNP in the last 8760 hours.  CBG: No results for input(s): GLUCAP in the last 168 hours.  Radiological Exams on Admission: No results found.  EKG: None  Assessment/Plan Principal Problem:   Syphilis Active Problems:   Human immunodeficiency virus (HIV) disease (HCC)   Pink eye disease of left eye   Rash    Syphilis  Patient presented with rash involving his palms and his body with significantly positive RPR titer. Unable to coordinate the care as outpatient.  Patient presented today with left pinkeye which  concerning about ocular neurosyphilis. LP to be done in the morning, check CSF VDRL. If patient has neurosyphilis will need high-dose (3-4 million units of penicillin ) and a PICC line. Concern for Jarisch-Herxheimer reaction, because  of significantly positive titers and a high doses of penicillin. Start penicillin treatment after LP obtained in a.m. And monitor closely.  Left pink eye Left pink eye,  Itchy but not painful new on 01/23/2015. Denies blurred vision. Cannot rule out ocular neurosyphilis versus viral conjunctivitis. Patient might need evaluation by ophthalmology.   Rash  Miliary rash involving his body and desquamatingrash involving his palms and soles. This is likely secondary to the late syphilis.  HIV/AIDS CD4 count of 90 checked on 11/16/2014, before that CD4 count was 10 in April 2016.  Patient follows with Dr. Ilsa Iha, continue ARV medications.   Code Status: Full code Family Communication:  Plan discussed with the patient, please do NOT discuss any of the patient health issues with his family without previous permission from Mr. Vorndran. Disposition Plan: Med-Surg  Time spent: 70 minutes  Kassidee Narciso A, MD [facility] Pager [phone]

## 2015-01-23 NOTE — Progress Notes (Signed)
Patient with PMH significant for HIV, CD at 90. Needs admission for treatment of neurosyphilis, Also patient with Left eye with redness, mild pain. Might need opthalmology evaluation. Please review Dr Drue SecondSnider note for further recommendations. Accepted to Med-surgery bed.   Derek Regalado,MD.

## 2015-01-23 NOTE — Progress Notes (Signed)
Patient ID: Derek Donovan, male   DOB: [birth year], 49 y.o.   MRN: [id number]       Patient ID: Derek Donovan, male   DOB: 1965/11/13, 49 y.o.   MRN: [id number]  HPI 49yo M with HIV disease, poorly controlled. CD 4 count of 90/VL 10,800, genotype [id number]. On genvoya-DRV. He states that he is taking regularly in addition to bactrim daily plus azithromycin weekly. He was last seen in November, presented with flaking,desquamating rash to palms bilaterally, red eyes. His labs revealed incredibly high titer for syphilis with RPR to  Concern for neurosyphilis, in addition to secondary syphilis. Attempted to have him get [facility] plus IR to perform LP, however he was unable to make appt, then only had [facility]  In the meantime, he has intense red left eye overnight. No crusting, some mild pain. He is concerned that this is pink eye. 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  Outpatient Encounter Prescriptions as of 01/23/2015  Medication Sig  . azithromycin (ZITHROMAX) 600 MG tablet Take 2 tablets (1,200 mg total) by mouth once a week.  . Darunavir Ethanolate (PREZISTA) 800 MG tablet Take 1 tablet (800 mg total) by mouth daily with breakfast.  . elvitegravir-cobicistat-emtricitabine-tenofovir (GENVOYA) 150-150-200-10 MG TABS tablet Take 1 tablet by mouth daily with breakfast.  . Polyethyl Glycol-Propyl Glycol (LUBRICANT EYE DROPS) 0.4-0.3 % SOLN Apply 1 drop to eye 2 (two) times daily.  . sertraline (ZOLOFT) 50 MG tablet Take 1 tablet (50 mg total) by mouth daily.  Marland Kitchen sulfamethoxazole-trimethoprim (BACTRIM) 400-80 MG per tablet Take 1 tablet by mouth daily.   No facility-administered encounter medications on file as of 01/23/2015.     Patient Active Problem List   Diagnosis Date Noted  . Human immunodeficiency virus (HIV)  disease (HCC) 04/23/2010  . MONILIASIS, ORAL 04/23/2010  . TOBACCO USE 04/23/2010  . SKIN LESION 04/23/2010  . CACHEXIA 04/23/2010     Health Maintenance Due  Topic Date Due  . TETANUS/TDAP  02/07/1984     Review of Systems + left pink eye. + rash. No headache. No tinnitus. No n/v/diarrhea Physical Exam   BP 121/79 mmHg  Pulse 77  Temp(Src) 97.3 F (36.3 C) (Oral)  Wt 134 lb (60.782 kg) Physical Exam  Constitutional: He is oriented to person, place, and time. He appears chronically ill.No distress.  HENT: left eye conjunctivitis. No pain with EOMI Mouth/Throat: Oropharynx is clear and moist. No oropharyngeal exudate.  Cardiovascular: Normal rate, regular rhythm and normal heart sounds. Exam reveals no gallop and no friction rub.  No murmur heard.  Pulmonary/Chest: Effort normal and breath sounds normal. No respiratory distress. He has no wheezes.  Abdominal: Soft. Bowel sounds are normal. He exhibits no distension. There is no tenderness.  Lymphadenopathy:  He has no cervical adenopathy.  Neurological: He is alert and oriented to person, place, and time.  Skin: Skin is warm and dry. Areas of desquamation on palms bilaterally, patches of hypopigmentation to face and arms Psychiatric: He has a normal mood and affect. His behavior is normal.    Lab Results  Component Value Date   CD4TCELL 7* 11/28/2014   Lab Results  Component Value Date   CD4TABS 90* 11/28/2014   CD4TABS 10* 05/24/2014   CD4TABS 20* 02/16/2014   Lab Results  Component Value Date   HIV1RNAQUANT 10816* 12/27/2014   Lab Results  Component Value Date   HEPBSAB POS* 10/03/2011   No results found for: RPR  CBC Lab Results  Component Value Date   WBC 3.5* 05/24/2014   RBC 5.02 05/24/2014   HGB 14.5 05/24/2014   HCT 42.4 05/24/2014   PLT 174 05/24/2014   MCV 84.5 05/24/2014   MCH 28.9 05/24/2014   MCHC 34.2 05/24/2014   RDW 13.6 05/24/2014   LYMPHSABS 0.6* 05/24/2014   MONOABS 0.6  05/24/2014   EOSABS 0.7 05/24/2014   BASOSABS 0.0 05/24/2014   BMET Lab Results  Component Value Date   NA 136 05/24/2014   K 4.0 05/24/2014   CL 103 05/24/2014   CO2 27 05/24/2014   GLUCOSE 86 05/24/2014   BUN 15 05/24/2014   CREATININE 1.08 05/24/2014   CALCIUM 8.3* 05/24/2014   GFRNONAA 80 05/24/2014   GFRAA >89 05/24/2014     Assessment and Plan  Syphilis = would like to have him admitted for neurosyphilis work up and facilitate picc line placement and wash how he tolerates penicillin. He will need 24MU penicillin continuous infusion. Concern for J-H reaction given such high titers  Isolated left eye conjunctivitis = difficult to tell if this is herpes vs. Syphilis or simply adenovirus. Would do fluorescent exam to see if any abn on cornea. At last visit 4 wk ago, he had bilateral conjunctivitis, but very mild for which we gave him lubricant eye drops thinking it was due to work related cleaning fluid exposure  hiv disease =poor adherence for unclear reason. He has poor insight to his health. We repeated his genotype at last visit and has not developed any new resistance. Continue with genvoya daily plus darunavir 800mg  daily  oi proph = continue on daily bactrim plus weekly azithromycin  Palmar rash = likely secondary syphilis  Depression = recommend to continue with zoloft  Spent 40 min in direct patient care for coordination of admission

## 2015-01-24 ENCOUNTER — Inpatient Hospital Stay (HOSPITAL_COMMUNITY): Payer: 59

## 2015-01-24 ENCOUNTER — Observation Stay (HOSPITAL_COMMUNITY): Payer: 59

## 2015-01-24 ENCOUNTER — Ambulatory Visit: Payer: 59 | Admitting: Internal Medicine

## 2015-01-24 DIAGNOSIS — H109 Unspecified conjunctivitis: Secondary | ICD-10-CM

## 2015-01-24 DIAGNOSIS — A523 Neurosyphilis, unspecified: Secondary | ICD-10-CM | POA: Diagnosis present

## 2015-01-24 LAB — BASIC METABOLIC PANEL
ANION GAP: 3 — AB (ref 5–15)
BUN: 19 mg/dL (ref 6–20)
CALCIUM: 9 mg/dL (ref 8.9–10.3)
CO2: 26 mmol/L (ref 22–32)
Chloride: 106 mmol/L (ref 101–111)
Creatinine, Ser: 1.13 mg/dL (ref 0.61–1.24)
GFR calc Af Amer: >60 mL/min (ref >60)
GLUCOSE: 107 mg/dL — AB (ref 65–99)
Potassium: 4.4 mmol/L (ref 3.5–5.1)
SODIUM: 135 mmol/L (ref 135–145)

## 2015-01-24 LAB — CBC
HCT: 40.2 % (ref 39.0–52.0)
Hemoglobin: 13.5 g/dL (ref 13.0–17.0)
MCH: 28.2 pg (ref 26.0–34.0)
MCHC: 33.6 g/dL (ref 30.0–36.0)
MCV: 83.9 fL (ref 78.0–100.0)
Platelets: 108 10*3/uL — ABNORMAL LOW (ref 150–400)
RBC: 4.79 MIL/uL (ref 4.22–5.81)
RDW: 16.9 % — AB (ref 11.5–15.5)
WBC: 4.7 10*3/uL (ref 4.0–10.5)

## 2015-01-24 LAB — CSF CELL COUNT WITH DIFFERENTIAL
EOS CSF: 0 % (ref 0–1)
LYMPHS CSF: 94 % — AB (ref 40–80)
Monocyte-Macrophage-Spinal Fluid: 0 % — ABNORMAL LOW (ref 15–45)
RBC Count, CSF: 0 /mm3
Segmented Neutrophils-CSF: 6 % (ref 0–6)
TUBE #: 4
WBC, CSF: 13 /mm3 (ref 0–5)

## 2015-01-24 LAB — GLUCOSE, CSF: Glucose, CSF: 55 mg/dL (ref 40–70)

## 2015-01-24 LAB — PATHOLOGIST SMEAR REVIEW

## 2015-01-24 LAB — PROTEIN, CSF: Total  Protein, CSF: 63 mg/dL — ABNORMAL HIGH (ref 15–45)

## 2015-01-24 MED ORDER — SODIUM CHLORIDE 0.9 % IV BOLUS (SEPSIS)
500.0000 mL | Freq: Once | INTRAVENOUS | Status: AC
  Administered 2015-01-24: 500 mL via INTRAVENOUS

## 2015-01-24 MED ORDER — PENICILLIN G POTASSIUM 20000000 UNITS IJ SOLR
12.0000 10*6.[IU] | Freq: Two times a day (BID) | INTRAMUSCULAR | Status: DC
Start: 2015-01-24 — End: 2015-01-26
  Administered 2015-01-24 – 2015-01-26 (×4): 12 10*6.[IU] via INTRAVENOUS
  Filled 2015-01-24 (×5): qty 12

## 2015-01-24 NOTE — Consult Note (Addendum)
[facility]       Reason for Consult: possible neurosyphilis    Referring Physician: Dr. Arthor CaptainElmahi  Principal Problem:   Syphilis Active Problems:   Human immunodeficiency virus (HIV) disease (HCC)   Pink eye disease of left eye   Rash   Syphilis of central nervous system   . [START ON 01/26/2015] azithromycin  1,200 mg Oral Weekly  . Darunavir Ethanolate  800 mg Oral Q breakfast  . elvitegravir-cobicistat-emtricitabine-tenofovir  1 tablet Oral Q breakfast  . penicillin g continuous IV infusion  24 Million Units Intravenous Q12H  . polyvinyl alcohol  1 drop Both Eyes BID  . sertraline  50 mg Oral Daily  . sulfamethoxazole-trimethoprim  1 tablet Oral Daily    Recommendations: picc line Penicillin constant infusion 24 million units through December 26th Weekly CBC, CMP to [facility] Ophthalmology exam (outpatient ok if ophthalmology does not feel he needs acute intervention other than treatment for neurosyphilis) - I tried to call but got voicemail of Dr. Allena KatzPatel NSAIDs as needed for Jarisch-Herxheimer reaction CM for home Abx (ordered)  Assessment: He has very high RPR, signs of secondary syphilis, uncontrolled HIV and conjunctivitis in the setting of increased WBCs in the CSF c/w neurosyphilis and possible uveitis.     Antibiotics: To start penicillin Bactrim, azithromycin prophylaxis Genvoya/darunavir  HPI: Derek Donovan is a 49 y.o. male with HIV, poorly controlled and with recently CD4 of 90 and viral load of 10,816.  Now on Genvoya and Prezista due to resistance with 184V and T69D.  Noticed conjunctivitis first in right eye, now in left.   No blurry vision, no eye pain, no double vision.  Has been taking his HIV medication over the last 3-4 weeks.  No history of syphilis, was RPR negative in January.  No fever.  Also with rash on palms with desquamation.    Review of Systems:  Eyes: negative for visual disturbance Gastrointestinal: negative for  nausea and vomiting Musculoskeletal: negative for myalgias and arthralgias All other systems reviewed and are negative   Past Medical History  Diagnosis Date  . HIV infection (HCC)   . HIV (human immunodeficiency virus infection) (HCC)     Social History  Substance Use Topics  . Smoking status: Current Every Day Smoker -- 0.50 packs/day for 10 years  . Smokeless tobacco: Never Used  . Alcohol Use: 0.6 oz/week    1 Cans of beer per week    Family History  Problem Relation Age of Onset  . Hypertension Mother     Allergies  Allergen Reactions  . Epinephrine Shortness Of Breath    Physical Exam: Constitutional: in no apparent distress  Filed Vitals:   01/24/15 0622 01/24/15 1341  BP: 105/74 107/66  Pulse: 80 82  Temp: 98.4 F (36.9 C) 97.8 F (36.6 C)  Resp: 18 18   EYES: anicteric, left eye conjunctivitis ENMT: no thrush Cardiovascular: Cor RRR and No murmurs Respiratory: CTA B; normal respiratory effort GI: Bowel sounds are normal, liver is not enlarged, spleen is not enlarged Musculoskeletal: peripheral pulses normal, no pedal edema, no clubbing or cyanosis Skin: palmar rash with desquamation, bilateral Hematologic: no cervical lad  Lab Results  Component Value Date   WBC 4.7 01/24/2015   HGB 13.5 01/24/2015   HCT 40.2 01/24/2015   MCV 83.9 01/24/2015   PLT 108* 01/24/2015    Lab Results  Component Value Date   CREATININE 1.13 01/24/2015   BUN 19 01/24/2015  NA 135 01/24/2015   K 4.4 01/24/2015   CL 106 01/24/2015   CO2 26 01/24/2015    Lab Results  Component Value Date   ALT 17 05/24/2014   AST 22 05/24/2014   ALKPHOS 69 05/24/2014     Microbiology: Recent Results (from the past 240 hour(s))  CSF culture     Status: None (Preliminary result)   Collection Time: 01/24/15 11:19 AM  Result Value Ref Range Status   Specimen Description CSF  Final   Special Requests NONE  Final   Gram Stain   Final    CYTOSPIN WBC PRESENT,BOTH PMN AND  MONONUCLEAR NO ORGANISMS SEEN Gram Stain Report Called to,Read Back By and Verified With: D. BLOCK RN AT 1205 ON 12.13.16 BY SHUEA    Culture PENDING  Incomplete   Report Status PENDING  Incomplete    Staci Righter, MD [facility] [email or web address] [phone] pager  [phone] cell 01/24/2015, 2:08 PM

## 2015-01-24 NOTE — Procedures (Signed)
CLINICAL DATA: [HIV. Possible neurosyphilis] EXAM: DIAGNOSTIC LUMBAR PUNCTURE UNDER FLUOROSCOPIC GUIDANCE FLUOROSCOPY TIME:  Radiation Exposure Index (as provided by the fluoroscopic device): [82[66 microGy*m^2] PROCEDURE:  I discussed the risks (including hemorrhage, infection, headache, and nerve damage, among others), benefits, and alternatives to fluoroscopically guided lumbar puncture with the patient. We specifically discussed the high technical likelihood of success of the procedure. The patient understood and elected to undergo the procedure.  Standard time-out was employed. Following sterile skin prep and local anesthetic administration consisting of 1 percent lidocaine, a 22 gauge spinal needle was advanced without difficulty into the thecal sac at the at the [L3-4] level. Clear CSF was returned. Opening pressure was [13 cm of water].  12 cc of clear CSF was collected. The needle was subsequently removed and the skin cleansed and bandaged. No immediate complications were observed.  IMPRESSION: [  1. Technically successful fluoroscopically guided lumbar puncture. Opening pressure was 13 cm of water.  ]

## 2015-01-24 NOTE — Progress Notes (Signed)
[facility] PROGRESS NOTE   Derek Donovan ZOX:[id number] DOB: 1965/11/15 DOA: 01/23/2015 PCP: Judyann MunsonSNIDER, CYNTHIA, MD  HPI/Subjective: Denies any new complaints this morning, his left eye still pink and watery.  Assessment/Plan: Principal Problem:   Syphilis Active Problems:   Human immunodeficiency virus (HIV) disease (HCC)   Pink eye disease of left eye   Rash   Syphilis of central nervous system   Syphilis  Patient presented with rash involving his palms and his body with significantly positive RPR titer. Patient presented today with left pinkeye which concerning about ocular neurosyphilis. LP done and showed WBCs of 13, VDRL of CSF pending. Hospital lab cannot do FTA antibodies of CSF. If patient has neurosyphilis will need high-dose (3-4 million units of penicillin ) and a PICC line. Concern for Jarisch-Herxheimer reaction, because of significantly positive titers and a high doses of penicillin. Start penicillin treatment after LP obtained in a.m. And monitor closely. Per ID patient is high risk for neurosyphilis, patient will be started on neurosyphilis treatment does regardless.  Left pink eye Left pink eye, Itchy but not painful new on 01/23/2015. Denies blurred vision. Cannot rule out ocular neurosyphilis versus viral conjunctivitis. I spoke with ophthalmology Dr. Allena KatzPatel, he recommended head scan and LP rather than looking into the eye. Patient does not have blurry vision which decrease the chances of ocular syphilis  Rash  Miliary rash involving his body and desquamating rash involving his palms and soles. This is likely secondary to the late syphilis.  HIV/AIDS CD4 count of 90 checked on 11/16/2014, before that CD4 count was 10 in April 2016.  Patient follows with Dr. Drue SecondSnider, continue ART medications.  Code Status: Full Code Family Communication: Plan discussed with the patient. Disposition Plan: Remains inpatient Diet: Diet regular Room service  appropriate?: Yes; Fluid consistency:: Thin  Consultants:  ID  Procedures:  LP done by interventional radiology on 01/24/2015  Antibiotics:  Plans to start penicillin later today   Objective: Filed Vitals:   01/24/15 0622 01/24/15 1341  BP: 105/74 107/66  Pulse: 80 82  Temp: 98.4 F (36.9 C) 97.8 F (36.6 C)  Resp: 18 18    Intake/Output Summary (Last 24 hours) at 01/24/15 1429 Last data filed at 01/24/15 1000  Gross per 24 hour  Intake    240 ml  Output    675 ml  Net   -435 ml   Filed Weights   01/23/15 1747  Weight: 57.879 kg (127 lb 9.6 oz)    Exam: General: Alert and awake, oriented x3, not in any acute distress. HEENT: anicteric sclera, pupils reactive to light and accommodation, EOMI CVS: S1-S2 clear, no murmur rubs or gallops Chest: clear to auscultation bilaterally, no wheezing, rales or rhonchi Abdomen: soft nontender, nondistended, normal bowel sounds, no organomegaly Extremities: no cyanosis, clubbing or edema noted bilaterally Neuro: Cranial nerves II-XII intact, no focal neurological deficits  Data Reviewed: Basic Metabolic Panel:  Recent Labs Lab 01/24/15 0530  NA 135  K 4.4  CL 106  CO2 26  GLUCOSE 107*  BUN 19  CREATININE 1.13  CALCIUM 9.0   Liver Function Tests: No results for input(s): AST, ALT, ALKPHOS, BILITOT, PROT, ALBUMIN in the last 168 hours. No results for input(s): LIPASE, AMYLASE in the last 168 hours. No results for input(s): AMMONIA in the last 168 hours. CBC:  Recent Labs Lab 01/24/15 0530  WBC 4.7  HGB 13.5  HCT 40.2  MCV 83.9  PLT 108*   Cardiac Enzymes: No results for  input(s): CKTOTAL, CKMB, CKMBINDEX, TROPONINI in the last 168 hours. BNP (last 3 results) No results for input(s): BNP in the last 8760 hours.  ProBNP (last 3 results) No results for input(s): PROBNP in the last 8760 hours.  CBG: No results for input(s): GLUCAP in the last 168 hours.  Micro Recent Results (from the past 240  hour(s))  CSF culture     Status: None (Preliminary result)   Collection Time: 01/24/15 11:19 AM  Result Value Ref Range Status   Specimen Description CSF  Final   Special Requests NONE  Final   Gram Stain   Final    CYTOSPIN WBC PRESENT,BOTH PMN AND MONONUCLEAR NO ORGANISMS SEEN Gram Stain Report Called to,Read Back By and Verified With: D. BLOCK RN AT 1205 ON 12.13.16 BY SHUEA    Culture PENDING  Incomplete   Report Status PENDING  Incomplete     Studies: Dg Fluoro Guide Lumbar Puncture  01/24/2015  CLINICAL DATA:  HIV.  Possible neurosyphilis EXAM: DIAGNOSTIC LUMBAR PUNCTURE UNDER FLUOROSCOPIC GUIDANCE FLUOROSCOPY TIME:  Radiation Exposure Index (as provided by the fluoroscopic device): 66 microGy*m^2 PROCEDURE: I discussed the risks (including hemorrhage, infection, headache, and nerve damage, among others), benefits, and alternatives to fluoroscopically guided lumbar puncture with the patient. We specifically discussed the high technical likelihood of success of the procedure. The patient understood and elected to undergo the procedure. Standard time-out was employed. Following sterile skin prep and local anesthetic administration consisting of 1 percent lidocaine, a 22 gauge spinal needle was advanced without difficulty into the thecal sac at the at the L3-4 level. Clear CSF was returned. Opening pressure was 13 cm of water. 12 cc of clear CSF was collected. The needle was subsequently removed and the skin cleansed and bandaged. No immediate complications were observed. IMPRESSION: 1. Technically successful fluoroscopically guided lumbar puncture. Opening pressure was 13 cm of water. Electronically Signed   By: Gaylyn Rong M.D.   On: 01/24/2015 12:05    Scheduled Meds: . [START ON 01/26/2015] azithromycin  1,200 mg Oral Weekly  . Darunavir Ethanolate  800 mg Oral Q breakfast  . elvitegravir-cobicistat-emtricitabine-tenofovir  1 tablet Oral Q breakfast  . penicillin g continuous  IV infusion  12 Million Units Intravenous Q12H  . polyvinyl alcohol  1 drop Both Eyes BID  . sertraline  50 mg Oral Daily  . sulfamethoxazole-trimethoprim  1 tablet Oral Daily   Continuous Infusions: . sodium chloride 10 mL/hr at 01/23/15 2010       Time spent: 35 minutes    [facility]  [facility] Pager [phone] If 7PM-7AM, please contact night-coverage at [email or web address], password [facility] 01/24/2015, 2:29 PM  LOS: 1 day

## 2015-01-25 DIAGNOSIS — A539 Syphilis, unspecified: Secondary | ICD-10-CM

## 2015-01-25 DIAGNOSIS — R21 Rash and other nonspecific skin eruption: Secondary | ICD-10-CM

## 2015-01-25 DIAGNOSIS — A523 Neurosyphilis, unspecified: Principal | ICD-10-CM

## 2015-01-25 DIAGNOSIS — B20 Human immunodeficiency virus [HIV] disease: Secondary | ICD-10-CM

## 2015-01-25 DIAGNOSIS — H10022 Other mucopurulent conjunctivitis, left eye: Secondary | ICD-10-CM

## 2015-01-25 LAB — CBC
HEMATOCRIT: 37.6 % — AB (ref 39.0–52.0)
Hemoglobin: 12.4 g/dL — ABNORMAL LOW (ref 13.0–17.0)
MCH: 28.2 pg (ref 26.0–34.0)
MCHC: 33 g/dL (ref 30.0–36.0)
MCV: 85.5 fL (ref 78.0–100.0)
Platelets: 98 10*3/uL — ABNORMAL LOW (ref 150–400)
RBC: 4.4 MIL/uL (ref 4.22–5.81)
RDW: 16.9 % — AB (ref 11.5–15.5)
WBC: 4.7 10*3/uL (ref 4.0–10.5)

## 2015-01-25 LAB — HEMOGLOBIN A1C
Hgb A1c MFr Bld: 5.5 % (ref 4.8–5.6)
Mean Plasma Glucose: 111 mg/dL

## 2015-01-25 LAB — BASIC METABOLIC PANEL
Anion gap: 4 — ABNORMAL LOW (ref 5–15)
BUN: 13 mg/dL (ref 6–20)
CHLORIDE: 107 mmol/L (ref 101–111)
CO2: 24 mmol/L (ref 22–32)
Calcium: 8.3 mg/dL — ABNORMAL LOW (ref 8.9–10.3)
Creatinine, Ser: 1.32 mg/dL — ABNORMAL HIGH (ref 0.61–1.24)
GFR calc Af Amer: >60 mL/min (ref >60)
GLUCOSE: 116 mg/dL — AB (ref 65–99)
POTASSIUM: 4.4 mmol/L (ref 3.5–5.1)
Sodium: 135 mmol/L (ref 135–145)

## 2015-01-25 LAB — VDRL, CSF: VDRL Quant, CSF: 1:4 {titer} — ABNORMAL HIGH

## 2015-01-25 MED ORDER — SODIUM CHLORIDE 0.9 % IV BOLUS (SEPSIS)
500.0000 mL | Freq: Once | INTRAVENOUS | Status: AC
  Administered 2015-01-25: 500 mL via INTRAVENOUS

## 2015-01-25 MED ORDER — SODIUM CHLORIDE 0.9 % IJ SOLN
10.0000 mL | INTRAMUSCULAR | Status: DC | PRN
  Administered 2015-01-26: 10 mL
  Filled 2015-01-25: qty 40

## 2015-01-25 NOTE — Progress Notes (Signed)
Peripherally Inserted Central Catheter/Midline Placement  The IV Nurse has discussed with the patient and/or persons authorized to consent for the patient, the purpose of this procedure and the potential benefits and risks involved with this procedure.  The benefits include less needle sticks, lab draws from the catheter and patient may be discharged home with the catheter.  Risks include, but not limited to, infection, bleeding, blood clot (thrombus formation), and puncture of an artery; nerve damage and irregular heat beat.  Alternatives to this procedure were also discussed.  PICC/Midline Placement Documentation  PICC Single Lumen 01/25/15 PICC Right Basilic 41 cm 1 cm (Active)  Indication for Insertion or Continuance of Line Home intravenous therapies (PICC only) 01/25/2015 12:00 PM  Exposed Catheter (cm) 1 cm 01/25/2015 12:00 PM  Dressing Change Due 02/01/15 01/25/2015 12:00 PM       Stacie GlazeJoyce, Asami Lambright Horton 01/25/2015, 12:52 PM

## 2015-01-25 NOTE — Progress Notes (Signed)
[facility] PROGRESS NOTE   Derek Donovan ZOX:[id number] DOB: 1965-12-21 DOA: 01/23/2015 PCP: Judyann MunsonSNIDER, CYNTHIA, MD  HPI/Subjective: Denies any new complaints. No fever, no CP, no SOB, no focal neurologic deficits; denies blurred vision, left is less Red.  Assessment/Plan: Principal Problem:   Syphilis Active Problems:   Human immunodeficiency virus (HIV) disease (HCC)   Pink eye disease of left eye   Rash   Syphilis of central nervous system   Syphilis  -Patient presented with rash involving his palms and his body with significantly positive RPR titer. -Patient presented today with left pinkeye with concerning about ocular neurosyphilis (now pretty much r/o by ophthalmologist). -LP done and showed WBCs of 13, VDRL of CSF pending. Hospital lab cannot do FTA antibodies of CSF. -If patient has neurosyphilis will need high-dose (3-4 million units of penicillin ) and a PICC line. Concern for Jarisch-Herxheimer reaction, because of significantly positive titers and a high doses of penicillin. -will continue IV PNC; PICC in place -Per ID tx for 14 days with IV PNC; if stable ok to be discharge in am  Left pink eye -Left pink eye, Itchy but not painful new on 01/23/2015. Denies blurred vision. -ophthalmologist Dr. Allena KatzPatel, he recommended head scan and LP  -Patient does not have blurry vision which decrease the chances of ocular syphilis -[facility]  Rash  -Miliary rash involving his body and desquamating rash involving his palms and soles. -This is likely secondary to the late syphilis. -[facility] as recommended by ID  HIV/AIDS -CD4 count of 90 checked on 11/16/2014, before that CD4 count was 10 in April 2016.  -Patient follows with Dr. Drue SecondSnider -Will continue ART medications. -Continue prophylaxis   Code Status: Full Code Family Communication: Plan discussed with the patient. Disposition Plan: Remains inpatient Diet: Diet  regular Room service appropriate?: Yes; Fluid consistency:: Thin  Consultants:  ID  Procedures:  LP done by interventional radiology on 01/24/2015  Antibiotics:  Continue IV PNC   Objective: Filed Vitals:   01/25/15 1458 01/25/15 2140  BP: 103/63 110/68  Pulse: 75 63  Temp: 97.9 F (36.6 C) 97.8 F (36.6 C)  Resp: 18 18    Intake/Output Summary (Last 24 hours) at 01/25/15 2212 Last data filed at 01/25/15 1806  Gross per 24 hour  Intake    320 ml  Output   1050 ml  Net   -730 ml   Filed Weights   01/23/15 1747  Weight: 57.879 kg (127 lb 9.6 oz)    Exam: General: Alert and awake, oriented x3, not in any acute distress. Afebrile and w/o neurologic deficits. Patient with PICC line in place (inserted on 12/14) HEENT: anicteric sclera, pupils reactive to light and accommodation, EOMI. Left eye with redness  CVS: S1-S2 clear, no murmur rubs or gallops Chest: clear to auscultation bilaterally, no wheezing, rales or rhonchi Abdomen: soft nontender, nondistended, normal bowel sounds, no organomegaly Extremities: no cyanosis, clubbing or edema noted bilaterally Neuro: Cranial nerves II-XII intact, no focal neurological deficits  Data Reviewed: Basic Metabolic Panel:  Recent Labs Lab 01/24/15 0530 01/25/15 0140  NA 135 135  K 4.4 4.4  CL 106 107  CO2 26 24  GLUCOSE 107* 116*  BUN 19 13  CREATININE 1.13 1.32*  CALCIUM 9.0 8.3*   CBC:  Recent Labs Lab 01/24/15 0530 01/25/15 0140  WBC 4.7 4.7  HGB 13.5 12.4*  HCT 40.2 37.6*  MCV 83.9 85.5  PLT 108* 98*  Micro Recent Results (from the past 240 hour(s))  CSF culture     Status: None (Preliminary result)   Collection Time: 01/24/15 11:19 AM  Result Value Ref Range Status   Specimen Description CSF  Final   Special Requests NONE  Final   Gram Stain   Final    CYTOSPIN WBC PRESENT,BOTH PMN AND MONONUCLEAR NO ORGANISMS SEEN Gram Stain Report Called to,Read Back By and Verified With: D. BLOCK RN AT  1205 ON 12.13.16 BY SHUEA    Culture   Final    NO GROWTH < 24 HOURS Performed at [facility]    Report Status PENDING  Incomplete  Culture, fungus without smear     Status: None (Preliminary result)   Collection Time: 01/24/15 11:19 AM  Result Value Ref Range Status   Specimen Description CSF  Final   Special Requests NONE  Final   Culture   Final    CULTURE IN PROGRESS FOR FOUR WEEKS Performed at [facility]    Report Status PENDING  Incomplete     Studies: Dg Chest Port 1 View  01/24/2015  CLINICAL DATA:  Sudden onset fever. No other complaints. History of HIV. EXAM: PORTABLE CHEST 1 VIEW COMPARISON:  04/23/2010 FINDINGS: The heart size and mediastinal contours are within normal limits. Both lungs are clear. The visualized skeletal structures are unremarkable. IMPRESSION: No active disease. Electronically Signed   By: Burman Nieves M.D.   On: 01/24/2015 23:25   Dg Fluoro Guide Lumbar Puncture  01/24/2015  CLINICAL DATA:  HIV.  Possible neurosyphilis EXAM: DIAGNOSTIC LUMBAR PUNCTURE UNDER FLUOROSCOPIC GUIDANCE FLUOROSCOPY TIME:  Radiation Exposure Index (as provided by the fluoroscopic device): 66 microGy*m^2 PROCEDURE: I discussed the risks (including hemorrhage, infection, headache, and nerve damage, among others), benefits, and alternatives to fluoroscopically guided lumbar puncture with the patient. We specifically discussed the high technical likelihood of success of the procedure. The patient understood and elected to undergo the procedure. Standard time-out was employed. Following sterile skin prep and local anesthetic administration consisting of 1 percent lidocaine, a 22 gauge spinal needle was advanced without difficulty into the thecal sac at the at the L3-4 level. Clear CSF was returned. Opening pressure was 13 cm of water. 12 cc of clear CSF was collected. The needle was subsequently removed and the skin cleansed and bandaged. No immediate complications  were observed. IMPRESSION: 1. Technically successful fluoroscopically guided lumbar puncture. Opening pressure was 13 cm of water. Electronically Signed   By: Gaylyn Rong M.D.   On: 01/24/2015 12:05    Scheduled Meds: . [START ON 01/26/2015] azithromycin  1,200 mg Oral Weekly  . Darunavir Ethanolate  800 mg Oral Q breakfast  . elvitegravir-cobicistat-emtricitabine-tenofovir  1 tablet Oral Q breakfast  . penicillin g continuous IV infusion  12 Million Units Intravenous Q12H  . polyvinyl alcohol  1 drop Both Eyes BID  . sertraline  50 mg Oral Daily  . sulfamethoxazole-trimethoprim  1 tablet Oral Daily   Continuous Infusions: . sodium chloride 75 mL/hr at 01/25/15 1618     Time spent: 35 minutes    Vassie Loll  [facility] Pager [phone]  If 7PM-7AM, please contact night-coverage at [email or web address], password [facility] 01/25/2015, 10:12 PM  LOS: 2 days

## 2015-01-25 NOTE — Progress Notes (Signed)
    [facility]   Reason for visit: Follow up on neurosyphilis  Interval History: he started penicillin yesterday and had picc placed today.  Was febrile overnight, CSF shows positive VDRL.  Right eye clearing and ophthalmology does not think it is c/w uveitis or ocular involvement.  Feels fine, no headache. Continues on ARVs.   Physical Exam: Constitutional:  Filed Vitals:   01/25/15 0638 01/25/15 1458  BP: 100/58 103/63  Pulse:  75  Temp:  97.9 F (36.6 C)  Resp:  18   patient appears in NAD Respiratory: Normal respiratory effort; CTA B Cardiovascular: RRR  Review of Systems: Constitutional: negative for fatigue and malaise Gastrointestinal: negative for diarrhea Musculoskeletal: negative for myalgias and arthralgias  Lab Results  Component Value Date   WBC 4.7 01/25/2015   HGB 12.4* 01/25/2015   HCT 37.6* 01/25/2015   MCV 85.5 01/25/2015   PLT 98* 01/25/2015    Lab Results  Component Value Date   CREATININE 1.32* 01/25/2015   BUN 13 01/25/2015   NA 135 01/25/2015   K 4.4 01/25/2015   CL 107 01/25/2015   CO2 24 01/25/2015    Lab Results  Component Value Date   ALT 17 05/24/2014   AST 22 05/24/2014   ALKPHOS 69 05/24/2014     Microbiology: Recent Results (from the past 240 hour(s))  CSF culture     Status: None (Preliminary result)   Collection Time: 01/24/15 11:19 AM  Result Value Ref Range Status   Specimen Description CSF  Final   Special Requests NONE  Final   Gram Stain   Final    CYTOSPIN WBC PRESENT,BOTH PMN AND MONONUCLEAR NO ORGANISMS SEEN Gram Stain Report Called to,Read Back By and Verified With: D. BLOCK RN AT 1205 ON 12.13.16 BY SHUEA    Culture   Final    NO GROWTH < 24 HOURS [facility]    Report Status PENDING  Incomplete  Culture, fungus without smear     Status: None (Preliminary result)   Collection Time: 01/24/15 11:19 AM  Result Value Ref Range Status   Specimen Description CSF   Final   Special Requests NONE  Final   Culture   Final    CULTURE IN PROGRESS FOR FOUR WEEKS Performed at [facility]    Report Status PENDING  Incomplete    Impression/Plan:  1. Neurosyphilis - confirmed.  Will need to continue with 14 days total of penicillin.  I have put in a consult for CM for home health.  2.  HIV - needs to have better compliance and this was emphasized.  Continue with ARVs, OI prophylaxis.   We will arrange follow up in our clinic.

## 2015-01-26 LAB — BASIC METABOLIC PANEL
Anion gap: 4 — ABNORMAL LOW (ref 5–15)
BUN: 11 mg/dL (ref 6–20)
CALCIUM: 8 mg/dL — AB (ref 8.9–10.3)
CHLORIDE: 107 mmol/L (ref 101–111)
CO2: 26 mmol/L (ref 22–32)
CREATININE: 1.11 mg/dL (ref 0.61–1.24)
GFR calc Af Amer: >60 mL/min (ref >60)
GFR calc non Af Amer: >60 mL/min (ref >60)
GLUCOSE: 97 mg/dL (ref 65–99)
Potassium: 4.2 mmol/L (ref 3.5–5.1)
Sodium: 137 mmol/L (ref 135–145)

## 2015-01-26 LAB — CBC
HCT: 35.2 % — ABNORMAL LOW (ref 39.0–52.0)
HEMOGLOBIN: 11.8 g/dL — AB (ref 13.0–17.0)
MCH: 28.5 pg (ref 26.0–34.0)
MCHC: 33.5 g/dL (ref 30.0–36.0)
MCV: 85 fL (ref 78.0–100.0)
PLATELETS: 97 10*3/uL — AB (ref 150–400)
RBC: 4.14 MIL/uL — ABNORMAL LOW (ref 4.22–5.81)
RDW: 17.5 % — ABNORMAL HIGH (ref 11.5–15.5)
WBC: 4.5 10*3/uL (ref 4.0–10.5)

## 2015-01-26 MED ORDER — HEPARIN SOD (PORK) LOCK FLUSH 100 UNIT/ML IV SOLN
250.0000 [IU] | INTRAVENOUS | Status: AC | PRN
  Administered 2015-01-26: 250 [IU]

## 2015-01-26 MED ORDER — PENICILLIN G POTASSIUM 20000000 UNITS IJ SOLR: 12.0000 10*6.[IU] | Freq: Two times a day (BID) | INTRAVENOUS | Status: DC

## 2015-01-26 MED ORDER — PENICILLIN G POTASSIUM 20000000 UNITS IJ SOLR: 12.0000 10*6.[IU] | Freq: Two times a day (BID) | INTRAMUSCULAR | Status: AC

## 2015-01-26 NOTE — Discharge Summary (Signed)
Physician Discharge Summary  COLUMBUS ICE ZOX:[id number] DOB: 02-20-1965 DOA: 01/23/2015  PCP: Judyann Munson, MD  Admit date: 01/23/2015 Discharge date: 01/26/2015  Time spent: 35 minutes  Recommendations for Outpatient Follow-up:  Follow [facility] to check on electrolytes and renal function   Discharge Diagnoses:  Principal Problem:   Syphilis Active Problems:   Human immunodeficiency virus (HIV) disease (HCC)   Pink eye disease of left eye   Rash   Syphilis of central nervous system   Discharge Condition: stable and improved. Will discharge home with [facility] services to complete IV antibiotic therapy as instructed by ID service.  Diet recommendation: regular diet   Filed Weights   01/23/15 1747  Weight: 57.879 kg (127 lb 9.6 oz)    History of present illness:  49 y.o. male with past regular history of HIV CD4 count of 90, 1 to his PCP complaining about left pink eye today. Patient reported about 3 weeks ago he started to have body rash, discoid mating rash involving his palms and soles, his RPR tested positive with impressively high titer (RPR tested negative several times before). Today patient had a left pinkeye so he rushed to his PCP who referred him to the hospital for evaluation of neurosyphilis. Patient denies any blurred vision, any pain in the eye, denies any headache, neck or back pain. ID to evaluate in the morning, he might need ophthalmologist.  Hospital Course:  Syphilis  -Patient presented with rash involving his palms and his body with significantly positive RPR titer. -Patient presented today with left pinkeye with concerning about ocular neurosyphilis (now pretty much r/o by ophthalmologist). -LP done and showed WBCs of 13, VDRL of CSF pending. Hospital lab cannot do FTA antibodies of CSF. -If patient has neurosyphilis will need high-dose (3-4 million units of penicillin ) and a PICC line. Concern for Jarisch-Herxheimer reaction, because of significantly  positive titers and a high doses of penicillin. -will continue IV PNC; PICC in place as instructed by ID (Plan is to complete a total of 14 days with IV PNC)  Left pink eye -Left pink eye,Itchy but not painful new on 01/23/2015.  -Denies blurred vision or any other complaints at discharge. -ophthalmologist Dr. Allena Katz, he recommended head scan and LP  -Patient does not have blurry vision which decrease the chances of ocular syphilis -continue tx with [facility] IV  Rash  -Miliary rash involving his body and desquamating rash involving his palms and soles. -This is likely secondary to the late syphilis. -continue treatment with PNC IV as recommended by ID  HIV/AIDS -CD4 count of 90 checked on 11/16/2014, before that CD4 count was 10 in April 2016.  -Patient follows with Dr. Drue Second -Will continue ART medications. -Continue prophylaxis drugs (zithromax and Bactrim)  Procedures:  See below for x-ray reports   Consultations:  ID  Ophthalmology    Discharge Exam: Filed Vitals:   01/25/15 2140 01/26/15 0556  BP: 110/68 103/60  Pulse: 63 65  Temp: 97.8 F (36.6 C) 98 F (36.7 C)  Resp: 18 18   General: Alert and awake, oriented x3, not in any acute distress. Afebrile and w/o neurologic deficits. Patient with PICC line in place (inserted on 12/14) HEENT: anicteric sclera, pupils reactive to light and accommodation, EOMI. Left eye with redness  CVS: S1-S2 clear, no murmur rubs or gallops Chest: clear to auscultation bilaterally, no wheezing, rales or rhonchi Abdomen: soft nontender, nondistended, normal bowel sounds, no organomegaly Extremities: no cyanosis, clubbing or edema noted bilaterally Neuro: Cranial  nerves II-XII intact, no focal neurological deficits   Discharge Instructions   Discharge Instructions    Discharge instructions    Complete by:  As directed   Take medications as prescribed Please follow with infectious disease office as instructed Keep yourself well  hydrated          Current Discharge Medication List    START taking these medications   Details  penicillin G potassium 12 Million Units in dextrose 5 % 500 mL Inject 12 Million Units into the vein every 12 (twelve) hours. To be given for another 12 days Qty: 288 Million Units, Refills: 0      CONTINUE these medications which have NOT CHANGED   Details  azithromycin (ZITHROMAX) 600 MG tablet Take 2 tablets (1,200 mg total) by mouth once a week. Qty: 10 tablet, Refills: 11   Associated Diagnoses: HIV (human immunodeficiency virus infection) (HCC)    Darunavir Ethanolate (PREZISTA) 800 MG tablet Take 1 tablet (800 mg total) by mouth daily with breakfast. Qty: 30 tablet, Refills: 6   Associated Diagnoses: HIV disease (HCC)    elvitegravir-cobicistat-emtricitabine-tenofovir (GENVOYA) 150-150-200-10 MG TABS tablet Take 1 tablet by mouth daily with breakfast. Qty: 30 tablet, Refills: 6   Associated Diagnoses: HIV disease (HCC)    Multiple Vitamin (MULTIVITAMIN WITH MINERALS) TABS tablet Take 1 tablet by mouth daily.    Polyethyl Glycol-Propyl Glycol (LUBRICANT EYE DROPS) 0.4-0.3 % SOLN Apply 1 drop to eye 2 (two) times daily. Qty: 30 mL, Refills: 1   Associated Diagnoses: Bilateral conjunctivitis    sertraline (ZOLOFT) 50 MG tablet Take 1 tablet (50 mg total) by mouth daily. Qty: 30 tablet, Refills: 11   Associated Diagnoses: Depression    sulfamethoxazole-trimethoprim (BACTRIM) 400-80 MG per tablet Take 1 tablet by mouth daily. Qty: 30 tablet, Refills: 11   Associated Diagnoses: HIV (human immunodeficiency virus infection) (HCC)       Allergies  Allergen Reactions  . Epinephrine Shortness Of Breath   Follow-up Information    Call Judyann MunsonSNIDER, CYNTHIA, MD.   Specialty:  Infectious Diseases   Why:  office to confirmed appointment details   Contact information:   [address] [place] [address] [phone]       The results of significant diagnostics  from this hospitalization (including imaging, microbiology, ancillary and laboratory) are listed below for reference.    Significant Diagnostic Studies: Ct Head Wo Contrast  01/17/2015  CLINICAL DATA:  Syphilis, HIV infection EXAM: CT HEAD WITHOUT CONTRAST TECHNIQUE: Contiguous axial images were obtained from the base of the skull through the vertex without intravenous contrast. COMPARISON:  None. FINDINGS: No skull fracture is noted. No intracranial hemorrhage, mass effect or midline shift. No hydrocephalus. The gray and white-matter differentiation is preserved. Minimal cerebral atrophy. No acute cortical infarction. No mass lesion is noted on this unenhanced scan. IMPRESSION: No acute intracranial abnormality. Electronically Signed   By: Natasha MeadLiviu  Pop M.D.   On: 01/17/2015 09:22   Dg Chest Port 1 View  01/24/2015  CLINICAL DATA:  Sudden onset fever. No other complaints. History of HIV. EXAM: PORTABLE CHEST 1 VIEW COMPARISON:  04/23/2010 FINDINGS: The heart size and mediastinal contours are within normal limits. Both lungs are clear. The visualized skeletal structures are unremarkable. IMPRESSION: No active disease. Electronically Signed   By: Burman NievesWilliam  Stevens M.D.   On: 01/24/2015 23:25   Dg Fluoro Guide Lumbar Puncture  01/24/2015  CLINICAL DATA:  HIV.  Possible neurosyphilis EXAM: DIAGNOSTIC LUMBAR PUNCTURE UNDER FLUOROSCOPIC GUIDANCE  FLUOROSCOPY TIME:  Radiation Exposure Index (as provided by the fluoroscopic device): 66 microGy*m^2 PROCEDURE: I discussed the risks (including hemorrhage, infection, headache, and nerve damage, among others), benefits, and alternatives to fluoroscopically guided lumbar puncture with the patient. We specifically discussed the high technical likelihood of success of the procedure. The patient understood and elected to undergo the procedure. Standard time-out was employed. Following sterile skin prep and local anesthetic administration consisting of 1 percent lidocaine, a  22 gauge spinal needle was advanced without difficulty into the thecal sac at the at the L3-4 level. Clear CSF was returned. Opening pressure was 13 cm of water. 12 cc of clear CSF was collected. The needle was subsequently removed and the skin cleansed and bandaged. No immediate complications were observed. IMPRESSION: 1. Technically successful fluoroscopically guided lumbar puncture. Opening pressure was 13 cm of water. Electronically Signed   By: Gaylyn Rong M.D.   On: 01/24/2015 12:05    Microbiology: Recent Results (from the past 240 hour(s))  Anaerobic culture     Status: None (Preliminary result)   Collection Time: 01/24/15 11:19 AM  Result Value Ref Range Status   Specimen Description CSF  Final   Special Requests NONE  Final   Gram Stain PENDING  Incomplete   Culture   Final    NO ANAEROBES ISOLATED; CULTURE IN PROGRESS FOR 5 DAYS Performed at [facility]    Report Status PENDING  Incomplete  CSF culture     Status: None (Preliminary result)   Collection Time: 01/24/15 11:19 AM  Result Value Ref Range Status   Specimen Description CSF  Final   Special Requests NONE  Final   Gram Stain   Final    CYTOSPIN WBC PRESENT,BOTH PMN AND MONONUCLEAR NO ORGANISMS SEEN Gram Stain Report Called to,Read Back By and Verified With: D. BLOCK RN AT 1205 ON 12.13.16 BY SHUEA    Culture   Final    NO GROWTH 2 DAYS Performed at [facility]    Report Status PENDING  Incomplete  Culture, fungus without smear     Status: None (Preliminary result)   Collection Time: 01/24/15 11:19 AM  Result Value Ref Range Status   Specimen Description CSF  Final   Special Requests NONE  Final   Culture   Final    CULTURE IN PROGRESS FOR FOUR WEEKS Performed at [facility]    Report Status PENDING  Incomplete     Labs: Basic Metabolic Panel:  Recent Labs Lab 01/24/15 0530 01/25/15 0140 01/26/15 0357  NA 135 135 137  K 4.4 4.4 4.2  CL 106 107 107  CO2 GLUCOSE 107* 116* 97  BUN CREATININE 1.13 1.32* 1.11  CALCIUM 9.0 8.3* 8.0*   CBC:  Recent Labs Lab 01/24/15 0530 01/25/15 0140 01/26/15 0357  WBC 4.7 4.7 4.5  HGB 13.5 12.4* 11.8*  HCT 40.2 37.6* 35.2*  MCV 83.9 85.5 85.0  PLT 108* 98* 97*   Signed:  Vassie Loll  [facility] 01/26/2015, 11:19 AM

## 2015-01-26 NOTE — Progress Notes (Signed)
[id number]/Derek Donovan with [facility] has made of the needed arrangments for the iv pcn infusion and is aware per tct Derek Donovan that it has to be restarted once patient is at home and not delayed/Rhonda Davis,RN,BSN,CCM:

## 2015-01-26 NOTE — Progress Notes (Signed)
[id number]/Dalya Maselli,RN,BSN,CCM:Karen with [facility] notified of dc and need for iv continuous infusion of pcn.

## 2015-01-28 LAB — CSF CULTURE W GRAM STAIN: Culture: NO GROWTH

## 2015-01-28 LAB — CSF CULTURE

## 2015-01-29 LAB — ANAEROBIC CULTURE

## 2015-01-30 LAB — CULTURE, BLOOD (ROUTINE X 2)
Culture: NO GROWTH
Culture: NO GROWTH

## 2015-01-31 ENCOUNTER — Ambulatory Visit (INDEPENDENT_AMBULATORY_CARE_PROVIDER_SITE_OTHER): Payer: 59 | Admitting: Internal Medicine

## 2015-01-31 ENCOUNTER — Encounter: Payer: Self-pay | Admitting: Internal Medicine

## 2015-01-31 VITALS — BP 127/86 | HR 85 | Temp 97.5°F | Wt 125.0 lb

## 2015-01-31 DIAGNOSIS — A523 Neurosyphilis, unspecified: Secondary | ICD-10-CM | POA: Diagnosis not present

## 2015-01-31 DIAGNOSIS — E875 Hyperkalemia: Secondary | ICD-10-CM

## 2015-01-31 DIAGNOSIS — B20 Human immunodeficiency virus [HIV] disease: Secondary | ICD-10-CM

## 2015-01-31 DIAGNOSIS — F329 Major depressive disorder, single episode, unspecified: Secondary | ICD-10-CM

## 2015-01-31 DIAGNOSIS — F32A Depression, unspecified: Secondary | ICD-10-CM

## 2015-01-31 DIAGNOSIS — Z716 Tobacco abuse counseling: Secondary | ICD-10-CM

## 2015-01-31 DIAGNOSIS — Z720 Tobacco use: Secondary | ICD-10-CM

## 2015-01-31 LAB — BASIC METABOLIC PANEL
BUN: 14 mg/dL (ref 7–25)
CHLORIDE: 103 mmol/L (ref 98–110)
CO2: 25 mmol/L (ref 20–31)
Calcium: 8.6 mg/dL (ref 8.6–10.3)
Creat: 1.06 mg/dL (ref 0.60–1.35)
GLUCOSE: 115 mg/dL — AB (ref 65–99)
POTASSIUM: 4 mmol/L (ref 3.5–5.3)
Sodium: 135 mmol/L (ref 135–146)

## 2015-01-31 NOTE — Progress Notes (Signed)
Patient ID: Derek Donovan, male   DOB: January 31, 1966, 49 y.o.   MRN: [id number]       Patient ID: Derek Natalric T Shreiner, male   DOB: January 31, 1966, 49 y.o.   MRN: [id number]  HPI Minerva Areolaric is a 49yo M with poorly controlled HIV disease, CD 4 count of 90/VL 10, 800. He is on genvoya-DRV. He was seen last week for worsening conjunctivitis but also known to have latent syphilis concerning for neurosyphilis. He was directly [facility] to [facility] for evaluation and management of neurosyphilis. He did start having J-H reaction with penicillin which is now improved. He has tolerated his picc line and no difficulty with penicillin infusion. His eyes have improved. He also noticed that his rash is starting to improve. He has quit smoking since being admitted.   Outpatient Encounter Prescriptions as of 01/31/2015  Medication Sig  . azithromycin (ZITHROMAX) 600 MG tablet Take 2 tablets (1,200 mg total) by mouth once a week.  . Darunavir Ethanolate (PREZISTA) 800 MG tablet Take 1 tablet (800 mg total) by mouth daily with breakfast.  . elvitegravir-cobicistat-emtricitabine-tenofovir (GENVOYA) 150-150-200-10 MG TABS tablet Take 1 tablet by mouth daily with breakfast.  . Multiple Vitamin (MULTIVITAMIN WITH MINERALS) TABS tablet Take 1 tablet by mouth daily.  . penicillin G potassium 12 Million Units in dextrose 5 % 500 mL Inject 12 Million Units into the vein every 12 (twelve) hours. To be given for another 12 days OK to use [facility] protocol for 24 million units/24 hours.  Bertram Gala. Polyethyl Glycol-Propyl Glycol (LUBRICANT EYE DROPS) 0.4-0.3 % SOLN Apply 1 drop to eye 2 (two) times daily.  . sertraline (ZOLOFT) 50 MG tablet Take 1 tablet (50 mg total) by mouth daily.  Marland Kitchen. sulfamethoxazole-trimethoprim (BACTRIM) 400-80 MG per tablet Take 1 tablet by mouth daily.   No facility-administered encounter medications on file as of 01/31/2015.     Patient Active Problem List   Diagnosis Date Noted  . Syphilis of central nervous system  01/24/2015  . Pink eye disease of left eye 01/23/2015  . Syphilis 01/23/2015  . Rash 01/23/2015  . Human immunodeficiency virus (HIV) disease (HCC) 04/23/2010  . MONILIASIS, ORAL 04/23/2010  . TOBACCO USE 04/23/2010  . SKIN LESION 04/23/2010  . CACHEXIA 04/23/2010     Health Maintenance Due  Topic Date Due  . TETANUS/TDAP  02/07/1984     Review of Systems Positive pertinents listed in hpi Physical Exam   BP 127/86 mmHg  Pulse 85  Temp(Src) 97.5 F (36.4 C) (Oral)  Wt 125 lb (56.7 kg) Physical Exam  Constitutional: He is oriented to person, place, and time.No distress. Chronically ill appearing HENT: slight injection of eyes Mouth/Throat: Oropharynx is clear and moist. No oropharyngeal exudate.  Cardiovascular: Normal rate, regular rhythm and normal heart sounds. Exam reveals no gallop and no friction rub.  No murmur heard.  Pulmonary/Chest: Effort normal and breath sounds normal. No respiratory distress. He has no wheezes.  Abdominal: Soft. Bowel sounds are normal. He exhibits no distension. There is no tenderness.  Lymphadenopathy:  He has no cervical adenopathy.  Neurological: He is alert and oriented to person, place, and time.  Skin: Skin is warm and dry. No rash noted. No erythema.  Psychiatric: He has a normal mood and affect. His behavior is normal.    Lab Results  Component Value Date   CD4TCELL 7* 11/28/2014   Lab Results  Component Value Date   CD4TABS 90* 11/28/2014   CD4TABS 10* 05/24/2014   CD4TABS 20*  02/16/2014   Lab Results  Component Value Date   HIV1RNAQUANT 10816* 12/27/2014   Lab Results  Component Value Date   HEPBSAB POS* 10/03/2011   No results found for: RPR  CBC Lab Results  Component Value Date   WBC 4.5 01/26/2015   RBC 4.14* 01/26/2015   HGB 11.8* 01/26/2015   HCT 35.2* 01/26/2015   PLT 97* 01/26/2015   MCV 85.0 01/26/2015   MCH 28.5 01/26/2015   MCHC 33.5 01/26/2015   RDW 17.5* 01/26/2015   LYMPHSABS 0.6* 05/24/2014    MONOABS 0.6 05/24/2014   EOSABS 0.7 05/24/2014   BASOSABS 0.0 05/24/2014   BMET Lab Results  Component Value Date   NA 137 01/26/2015   K 4.2 01/26/2015   CL 107 01/26/2015   CO2 26 01/26/2015   GLUCOSE 97 01/26/2015   BUN 11 01/26/2015   CREATININE 1.11 01/26/2015   CALCIUM 8.0* 01/26/2015   GFRNONAA >60 01/26/2015   GFRAA >60 01/26/2015     Assessment and Plan  Neurosyphilis = Doing well with penicillin. Taking it through 12/27, have home health pull picc line on 12/28. Will mop up with weekly pcn on 12/28 and 1/4th for im bic  hiv disease = poorly controlled, he appears more adherent to his medicaitons. Will check viral load in 6 wk  Prophylaxis = will need to continue on daily bactrim. And weekly azithromycin once cd 4 count > 50 x 3 mon  Depression = continue with zoloft  Hyperkalemia = will repeat bmp to ensure it is normalized  Smoking cessaiton = applauded his cessation. Encourage to keep his abstinence from smoking

## 2015-02-08 ENCOUNTER — Ambulatory Visit: Payer: 59

## 2015-02-08 ENCOUNTER — Telehealth: Payer: Self-pay | Admitting: *Deleted

## 2015-02-08 NOTE — Telephone Encounter (Signed)
Agreed. He should have his picc line removed. He is due to come back to clinic for IM bicillin inj on thur

## 2015-02-08 NOTE — Telephone Encounter (Signed)
Verbal order per Dr. Drue SecondSnider [facility]'s picc [facility] today. Wendall MolaJacqueline Greer [place]oeppen

## 2015-02-09 ENCOUNTER — Ambulatory Visit (INDEPENDENT_AMBULATORY_CARE_PROVIDER_SITE_OTHER): Payer: 59 | Admitting: *Deleted

## 2015-02-09 DIAGNOSIS — A539 Syphilis, unspecified: Secondary | ICD-10-CM | POA: Diagnosis not present

## 2015-02-09 MED ORDER — PENICILLIN G BENZATHINE 1200000 UNIT/2ML IM SUSP
1.2000 10*6.[IU] | Freq: Once | INTRAMUSCULAR | Status: AC
  Administered 2015-02-09: 1.2 10*6.[IU] via INTRAMUSCULAR

## 2015-02-13 DIAGNOSIS — A523 Neurosyphilis, unspecified: Secondary | ICD-10-CM | POA: Diagnosis not present

## 2015-02-14 ENCOUNTER — Telehealth: Payer: Self-pay | Admitting: *Deleted

## 2015-02-14 NOTE — Telephone Encounter (Signed)
Patient walked in 02/10/15 and left a request for FMLA.  He would like to know his return date. He has been out since 12/15 due to PICC placement and IV antibiotics, PICC was pulled 02/08/15.  Please advise. Andree CossHowell, Desmin Daleo M, RN

## 2015-02-14 NOTE — Telephone Encounter (Signed)
Can you touch base with Derek Donovan. i think we were going to try to find the paperwork that needs to be filled so that he can go back to work.

## 2015-02-15 ENCOUNTER — Ambulatory Visit: Payer: 59

## 2015-02-15 NOTE — Telephone Encounter (Signed)
Derek Donovan has been trying to reach him.

## 2015-02-16 ENCOUNTER — Ambulatory Visit: Payer: 59

## 2015-02-16 ENCOUNTER — Ambulatory Visit (INDEPENDENT_AMBULATORY_CARE_PROVIDER_SITE_OTHER): Payer: 59 | Admitting: *Deleted

## 2015-02-16 ENCOUNTER — Encounter: Payer: Self-pay | Admitting: *Deleted

## 2015-02-16 DIAGNOSIS — A539 Syphilis, unspecified: Secondary | ICD-10-CM

## 2015-02-16 MED ORDER — PENICILLIN G BENZATHINE 1200000 UNIT/2ML IM SUSP
1.2000 10*6.[IU] | Freq: Once | INTRAMUSCULAR | Status: AC
  Administered 2015-02-16: 1.2 10*6.[IU] via INTRAMUSCULAR

## 2015-02-17 NOTE — Telephone Encounter (Signed)
Letter faxed to [employer] and the patient is aware.

## 2015-02-21 LAB — CULTURE, FUNGUS WITHOUT SMEAR

## 2015-02-21 MED FILL — PREZISTA 800 MG TABS: 800 | 30 days supply | Qty: 30 | Fill #1

## 2015-02-21 MED FILL — GENVOYA TABLET: 150-150-200 | 30 days supply | Qty: 30 | Fill #1

## 2015-02-21 MED FILL — SERTRALINE HCL 50 MG TABLET: 50 | 30 days supply | Qty: 30 | Fill #1

## 2015-03-22 MED FILL — GENVOYA TABLET: 150-150-200 | 30 days supply | Qty: 30 | Fill #2

## 2015-03-22 MED FILL — PREZISTA 800 MG TABS: 800 | 30 days supply | Qty: 30 | Fill #2

## 2015-04-03 ENCOUNTER — Other Ambulatory Visit: Payer: Self-pay | Admitting: *Deleted

## 2015-04-03 DIAGNOSIS — B20 Human immunodeficiency virus [HIV] disease: Secondary | ICD-10-CM

## 2015-04-03 MED ORDER — ELVITEG-COBIC-EMTRICIT-TENOFAF 150-150-200-10 MG PO TABS: 1.0000 | ORAL_TABLET | Freq: Every day | ORAL | Status: DC

## 2015-04-03 MED ORDER — DARUNAVIR ETHANOLATE 800 MG PO TABS: 800.0000 mg | ORAL_TABLET | Freq: Every day | ORAL | Status: DC

## 2015-04-04 ENCOUNTER — Ambulatory Visit: Payer: 59 | Admitting: Internal Medicine

## 2015-05-01 ENCOUNTER — Other Ambulatory Visit (INDEPENDENT_AMBULATORY_CARE_PROVIDER_SITE_OTHER): Payer: Self-pay

## 2015-05-01 ENCOUNTER — Other Ambulatory Visit: Payer: Self-pay | Admitting: Internal Medicine

## 2015-05-01 ENCOUNTER — Other Ambulatory Visit: Payer: Self-pay | Admitting: *Deleted

## 2015-05-01 DIAGNOSIS — B20 Human immunodeficiency virus [HIV] disease: Secondary | ICD-10-CM

## 2015-05-01 DIAGNOSIS — Z113 Encounter for screening for infections with a predominantly sexual mode of transmission: Secondary | ICD-10-CM

## 2015-05-01 LAB — COMPLETE METABOLIC PANEL WITH GFR
ALBUMIN: 3.8 g/dL (ref 3.6–5.1)
ALK PHOS: 72 U/L (ref 40–115)
ALT: 11 U/L (ref 9–46)
AST: 19 U/L (ref 10–35)
BUN: 13 mg/dL (ref 7–25)
CHLORIDE: 106 mmol/L (ref 98–110)
CO2: 27 mmol/L (ref 20–31)
Calcium: 8.8 mg/dL (ref 8.6–10.3)
Creat: 1.11 mg/dL (ref 0.70–1.33)
GFR, EST NON AFRICAN AMERICAN: 77 mL/min (ref >=60)
GFR, Est African American: 89 mL/min (ref >=60)
GLUCOSE: 102 mg/dL — AB (ref 65–99)
POTASSIUM: 4.2 mmol/L (ref 3.5–5.3)
SODIUM: 139 mmol/L (ref 135–146)
Total Bilirubin: 0.4 mg/dL (ref 0.2–1.2)
Total Protein: 7.4 g/dL (ref 6.1–8.1)

## 2015-05-01 LAB — CBC WITH DIFFERENTIAL/PLATELET
BASOS PCT: 1 % (ref 0–1)
Basophils Absolute: 0 10*3/uL (ref 0.0–0.1)
EOS ABS: 0.2 10*3/uL (ref 0.0–0.7)
EOS PCT: 4 % (ref 0–5)
HCT: 42.8 % (ref 39.0–52.0)
Hemoglobin: 15 g/dL (ref 13.0–17.0)
Lymphocytes Relative: 38 % (ref 12–46)
Lymphs Abs: 1.7 10*3/uL (ref 0.7–4.0)
MCH: 30.9 pg (ref 26.0–34.0)
MCHC: 35 g/dL (ref 30.0–36.0)
MCV: 88.1 fL (ref 78.0–100.0)
MONO ABS: 0.6 10*3/uL (ref 0.1–1.0)
MONOS PCT: 12 % (ref 3–12)
MPV: 9.1 fL (ref 8.6–12.4)
Neutro Abs: 2.1 10*3/uL (ref 1.7–7.7)
Neutrophils Relative %: 45 % (ref 43–77)
PLATELETS: 231 10*3/uL (ref 150–400)
RBC: 4.86 MIL/uL (ref 4.22–5.81)
RDW: 14.4 % (ref 11.5–15.5)
WBC: 4.6 10*3/uL (ref 4.0–10.5)

## 2015-05-01 MED ORDER — CEPHALEXIN 500 MG PO TABS: 500.0000 mg | ORAL_TABLET | Freq: Four times a day (QID) | ORAL | Status: DC

## 2015-05-01 NOTE — Progress Notes (Signed)
Came in for facial cellulitis ,seen by health at work. Given cephalexin 500mg  TID, to check if rx is correct dosage. It also appears that he has not had recent lab work  Will check cd 4 count, viral load, cbc, bmp. And give cephalexin 500mg  QID

## 2015-05-02 ENCOUNTER — Encounter: Payer: Self-pay | Admitting: Pharmacy Technician

## 2015-05-02 ENCOUNTER — Telehealth: Payer: Self-pay | Admitting: Pharmacy Technician

## 2015-05-02 LAB — T-HELPER CELL (CD4) - (RCID CLINIC ONLY)
CD4 T CELL ABS: 240 /uL — AB (ref 400–2700)
CD4 T CELL HELPER: 14 % — AB (ref 33–55)

## 2015-05-02 LAB — RPR: RPR Ser Ql: REACTIVE — AB

## 2015-05-02 LAB — RPR TITER

## 2015-05-02 NOTE — Telephone Encounter (Signed)
His insurance has lapsed for [place] and Prezista.  I need to talk with him about whether he has new insurance or no insurance.  If it is the latter, I will want to help him get temporary assistance through [facility].

## 2015-05-03 LAB — HIV-1 RNA QUANT-NO REFLEX-BLD: HIV-1 RNA Quant, Log: <1.3 Log copies/mL (ref <1.30)

## 2015-05-03 LAB — FLUORESCENT TREPONEMAL AB(FTA)-IGG-BLD: Fluorescent Treponemal ABS: REACTIVE — AB

## 2015-05-04 ENCOUNTER — Telehealth: Payer: Self-pay | Admitting: Pharmacy Technician

## 2015-05-04 NOTE — Telephone Encounter (Signed)
Mr. Derek Donovan lost his insurance and will come by to meet with Marcelino DusterMichelle tomorrow about assistance for [place] and Genvoya.

## 2015-05-12 ENCOUNTER — Other Ambulatory Visit: Payer: Self-pay | Admitting: *Deleted

## 2015-05-12 DIAGNOSIS — B20 Human immunodeficiency virus [HIV] disease: Secondary | ICD-10-CM

## 2015-05-12 MED ORDER — ELVITEG-COBIC-EMTRICIT-TENOFAF 150-150-200-10 MG PO TABS: 1.0000 | ORAL_TABLET | Freq: Every day | ORAL | Status: DC

## 2015-05-12 MED ORDER — DARUNAVIR ETHANOLATE 800 MG PO TABS
800.0000 mg | ORAL_TABLET | Freq: Every day | ORAL | Status: DC
Start: 2015-05-12 — End: 2016-02-28

## 2015-05-23 ENCOUNTER — Encounter: Payer: Self-pay | Admitting: Internal Medicine

## 2015-08-30 ENCOUNTER — Ambulatory Visit: Payer: Self-pay

## 2015-09-01 ENCOUNTER — Encounter: Payer: Self-pay | Admitting: Internal Medicine

## 2015-11-09 NOTE — Telephone Encounter (Signed)
error 

## 2016-02-24 ENCOUNTER — Other Ambulatory Visit: Payer: Self-pay | Admitting: Internal Medicine

## 2016-02-24 DIAGNOSIS — B20 Human immunodeficiency virus [HIV] disease: Secondary | ICD-10-CM

## 2016-02-28 ENCOUNTER — Other Ambulatory Visit: Payer: Self-pay | Admitting: Internal Medicine

## 2016-02-28 DIAGNOSIS — B20 Human immunodeficiency virus [HIV] disease: Secondary | ICD-10-CM

## 2016-10-18 IMAGING — DX DG CHEST 1V PORT
1 series · 1 of 1 positions shown · non-contrast
Comparison: 04/23/2010

CLINICAL DATA: Sudden onset fever. No other complaints. History of
HIV.

EXAM:
PORTABLE CHEST 1 VIEW

[chest ap]
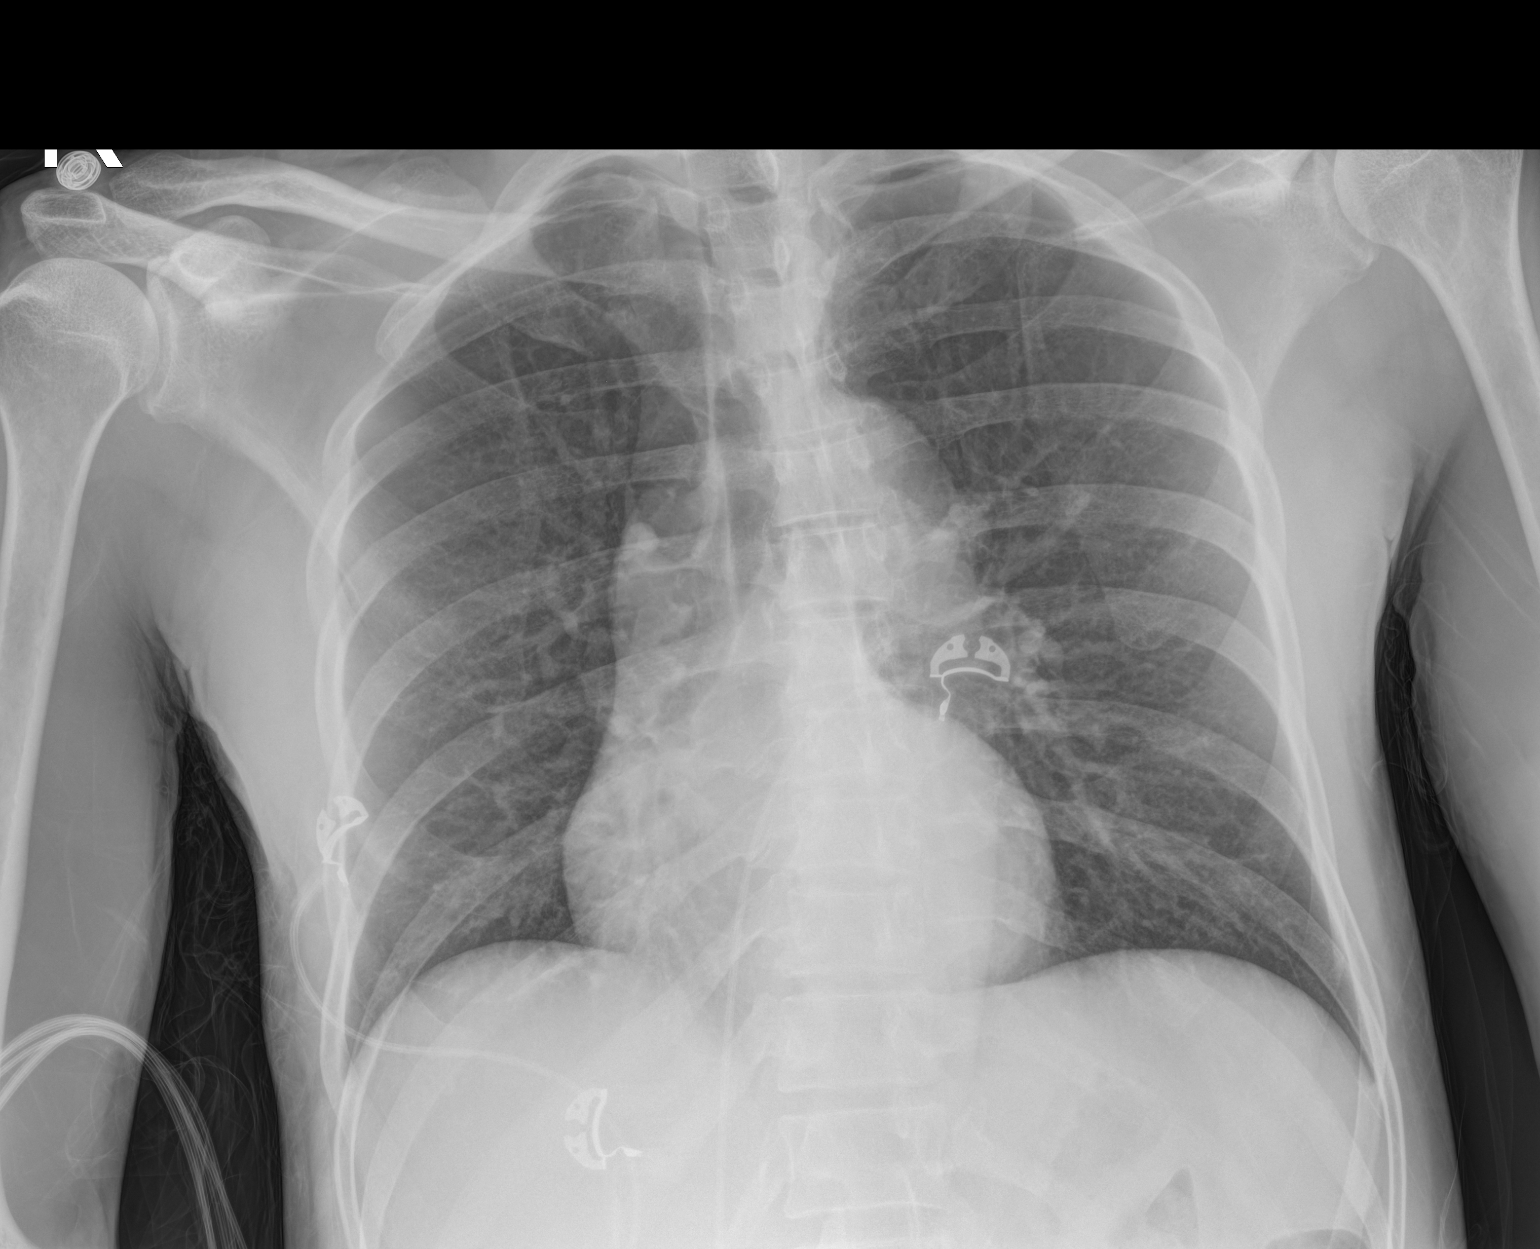

[1 of 1 positions shown; findings below may reference images not displayed]

FINDINGS: The heart size and mediastinal contours are within normal limits.
Both lungs are clear. The visualized skeletal structures are
unremarkable.
IMPRESSION: No active disease.

## 2018-12-09 ENCOUNTER — Other Ambulatory Visit: Payer: Self-pay

## 2018-12-09 DIAGNOSIS — Z20822 Contact with and (suspected) exposure to covid-19: Secondary | ICD-10-CM

## 2018-12-10 LAB — NOVEL CORONAVIRUS, NAA: SARS-CoV-2, NAA: NOT DETECTED

## 2019-10-22 ENCOUNTER — Telehealth: Payer: Self-pay

## 2019-10-22 NOTE — Telephone Encounter (Signed)
COVID-19 Pre-Screening Questions:10/22/19 ° °Do you currently have a fever (>100 °F), chills or unexplained body aches? NO ° °Are you currently experiencing new cough, shortness of breath, sore throat, runny nose?NO °•  °Have you been in contact with someone that is currently pending confirmation of Covid19 testing or has been confirmed to have the Covid19 virus?  NO ° °I have advised patient to call back to reschedule or convert to a MyChart visit if any Covid symptoms appear during the weekend. ° °**If the patient answers NO to ALL questions -  advise the patient to please call the clinic before coming to the office should any symptoms develop.  ° ° °

## 2019-10-25 ENCOUNTER — Ambulatory Visit: Payer: BC Managed Care – PPO | Admitting: Internal Medicine

## 2019-10-25 ENCOUNTER — Encounter: Payer: Self-pay | Admitting: Internal Medicine

## 2019-10-25 ENCOUNTER — Other Ambulatory Visit: Payer: Self-pay

## 2019-10-25 ENCOUNTER — Other Ambulatory Visit (HOSPITAL_COMMUNITY)
Admission: RE | Admit: 2019-10-25 | Discharge: 2019-10-25 | Disposition: A | Discharge status: Home / Self Care | Payer: BC Managed Care – PPO | Source: Ambulatory Visit | Attending: Internal Medicine | Admitting: Internal Medicine

## 2019-10-25 VITALS — BP 113/80 | HR 85 | Temp 98.1°F | Ht 66.0 in | Wt 130.0 lb

## 2019-10-25 DIAGNOSIS — Z8619 Personal history of other infectious and parasitic diseases: Secondary | ICD-10-CM

## 2019-10-25 DIAGNOSIS — R64 Cachexia: Secondary | ICD-10-CM

## 2019-10-25 DIAGNOSIS — B20 Human immunodeficiency virus [HIV] disease: Secondary | ICD-10-CM | POA: Diagnosis not present

## 2019-10-25 DIAGNOSIS — B029 Zoster without complications: Secondary | ICD-10-CM

## 2019-10-25 DIAGNOSIS — Z79899 Other long term (current) drug therapy: Secondary | ICD-10-CM | POA: Diagnosis not present

## 2019-10-25 MED ORDER — TIVICAY 50 MG PO TABS: 50.0000 mg | ORAL_TABLET | Freq: Every day | ORAL | 11 refills | Status: DC

## 2019-10-25 MED ORDER — VALACYCLOVIR HCL 1 G PO TABS: 1000.0000 mg | ORAL_TABLET | Freq: Three times a day (TID) | ORAL | 0 refills | Status: DC

## 2019-10-25 MED ORDER — SULFAMETHOXAZOLE-TRIMETHOPRIM 800-160 MG PO TABS: 1.0000 | ORAL_TABLET | Freq: Every day | ORAL | 6 refills | Status: DC

## 2019-10-25 MED ORDER — SYMTUZA 800-150-200-10 MG PO TABS: 1.0000 | ORAL_TABLET | Freq: Every day | ORAL | 11 refills | Status: DC

## 2019-10-25 NOTE — Patient Instructions (Signed)
   Valtrex = 1 tab 3 times per day x 10 days  Other pills are just once a day

## 2019-10-25 NOTE — Progress Notes (Signed)
RFV: return to care  Patient ID: Derek Donovan, male   DOB: Nov 09, 1965, 54 y.o.   MRN: [id number]  HPI 54yo M with hiv disease, hx of neurosyphilis (treated in 2017), Not on genvoya-prezista - since last year.   New rash onset since last week, onset on chest -clusters, tenderness.  Shingles ? 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 azithromycin (ZITHROMAX) 600 MG tablet Take 2 tablets (1,200 mg total) by mouth once a week. (Patient not taking: Reported on 10/25/2019)  . Cephalexin 500 MG tablet Take 1 tablet (500 mg total) by mouth 4 (four) times daily. (Patient not taking: Reported on 10/25/2019)  . GENVOYA 150-150-200-10 MG TABS tablet TAKE 1 TABLET BY MOUTH DAILY WITH BREAKFAST (Patient not taking: Reported on 10/25/2019)  . Multiple Vitamin (MULTIVITAMIN WITH MINERALS) TABS tablet Take 1 tablet by mouth daily. (Patient not taking: Reported on 10/25/2019)  . Polyethyl Glycol-Propyl Glycol (LUBRICANT EYE DROPS) 0.4-0.3 % SOLN Apply 1 drop to eye 2 (two) times daily. (Patient not taking: Reported on 10/25/2019)  . PREZISTA 800 MG tablet TAKE 1 TABLET(800 MG) BY MOUTH DAILY WITH BREAKFAST (Patient not taking: Reported on 10/25/2019)  . sertraline (ZOLOFT) 50 MG tablet Take 1 tablet (50 mg total) by mouth daily. (Patient not taking: Reported on 10/25/2019)  . sulfamethoxazole-trimethoprim (BACTRIM) 400-80 MG per tablet Take 1 tablet by mouth daily. (Patient not taking: Reported on 10/25/2019)   No facility-administered encounter medications on file as of 10/25/2019.     Patient Active Problem List   Diagnosis Date Noted  . Syphilis of central nervous system 01/24/2015  . Pink eye disease of left eye 01/23/2015  . Syphilis 01/23/2015  . Rash 01/23/2015  . Human immunodeficiency virus (HIV) disease (HCC) 04/23/2010  . MONILIASIS, ORAL 04/23/2010  .  TOBACCO USE 04/23/2010  . SKIN LESION 04/23/2010  . CACHEXIA 04/23/2010     Health Maintenance Due  Topic Date Due  . COVID-19 Vaccine (1) Never done  . TETANUS/TDAP  Never done  . COLONOSCOPY  Never done  . INFLUENZA VACCINE  09/12/2019     Review of Systems - loss of weight, malaise and new onset rash but 12 point ros is negative  Physical Exam   BP 113/80   Pulse 85   Temp 98.1 F (36.7 C) (Oral)   Ht 5\' 6"  (1.676 m)   Wt 130 lb (59 kg)   SpO2 98%   BMI 20.98 kg/m   gen = a  X o by 3 in NAD HEENT = no signs of thrush Cors = nl s1, s2 no g/m/r pulm = CTAB, no w/c/r Skin= +chest, back axilla, and left arm -clusters of vesicles with red base c/w shingles Lab Results  Component Value Date   CD4TCELL 14 (L) 05/01/2015   Lab Results  Component Value Date   CD4TABS 240 (L) 05/01/2015   CD4TABS 90 (L) 11/28/2014   CD4TABS 10 (L) 05/24/2014   Lab Results  Component Value Date   HIV1RNAQUANT <20 05/01/2015   Lab Results  Component Value Date   HEPBSAB POS (A) 10/03/2011   Lab Results  Component Value Date   LABRPR REACTIVE (A) 05/01/2015    CBC Lab Results  Component Value Date   WBC 4.6 05/01/2015   RBC 4.86 05/01/2015   HGB 15.0 05/01/2015   HCT 42.8 05/01/2015  PLT 231 05/01/2015   MCV 88.1 05/01/2015   MCH 30.9 05/01/2015   MCHC 35.0 05/01/2015   RDW 14.4 05/01/2015   LYMPHSABS 1.7 05/01/2015   MONOABS 0.6 05/01/2015   EOSABS 0.2 05/01/2015    BMET Lab Results  Component Value Date   NA 139 05/01/2015   K 4.2 05/01/2015   CL 106 05/01/2015   CO2 27 05/01/2015   GLUCOSE 102 (H) 05/01/2015   BUN 13 05/01/2015   CREATININE 1.11 05/01/2015   CALCIUM 8.8 05/01/2015   GFRNONAA 77 05/01/2015   GFRAA 89 05/01/2015      Assessment and Plan Shingles = Can go to work but keep rash covered. Will give valtrex 1gm TID x 10 days. Has some discomfort but unclear if he will have post herpetic neuralgia  hiv disease = Lets get labs and will  start back symtuza plus tivicay  Cachexia = anticipate he will gain weight with better virologic control  History of syphilis = will check rpr  oi proph = concern that his cd 4 count will be under 200 given 240 4 years ago. Will start on bactrim proph   rtc in 4-5 wk

## 2019-10-26 ENCOUNTER — Other Ambulatory Visit: Payer: Self-pay | Admitting: *Deleted

## 2019-10-26 DIAGNOSIS — B20 Human immunodeficiency virus [HIV] disease: Secondary | ICD-10-CM

## 2019-10-26 LAB — URINE CYTOLOGY ANCILLARY ONLY
Chlamydia: NEGATIVE
Comment: NEGATIVE
Comment: NORMAL
Neisseria Gonorrhea: NEGATIVE

## 2019-10-26 LAB — T-HELPER CELL (CD4) - (RCID CLINIC ONLY)
CD4 % Helper T Cell: 1 % — ABNORMAL LOW (ref 33–65)
CD4 T Cell Abs: <35 /uL — ABNORMAL LOW (ref 400–1790)

## 2019-10-26 MED ORDER — TIVICAY 50 MG PO TABS: 50.0000 mg | ORAL_TABLET | Freq: Every day | ORAL | 5 refills | Status: DC

## 2019-10-26 MED ORDER — SYMTUZA 800-150-200-10 MG PO TABS: 1.0000 | ORAL_TABLET | Freq: Every day | ORAL | 5 refills | Status: DC

## 2019-10-27 LAB — COMPLETE METABOLIC PANEL WITH GFR
AG Ratio: 1.3 (calc) (ref 1.0–2.5)
ALT: 8 U/L — ABNORMAL LOW (ref 9–46)
AST: 15 U/L (ref 10–35)
Albumin: 4.2 g/dL (ref 3.6–5.1)
Alkaline phosphatase (APISO): 82 U/L (ref 35–144)
BUN: 16 mg/dL (ref 7–25)
CO2: 30 mmol/L (ref 20–32)
Calcium: 9 mg/dL (ref 8.6–10.3)
Chloride: 105 mmol/L (ref 98–110)
Creat: 1.26 mg/dL (ref 0.70–1.33)
GFR, Est African American: 74 mL/min/{1.73_m2} (ref >=60)
GFR, Est Non African American: 64 mL/min/{1.73_m2} (ref >=60)
Globulin: 3.3 g/dL (calc) (ref 1.9–3.7)
Glucose, Bld: 95 mg/dL (ref 65–99)
Potassium: 4.4 mmol/L (ref 3.5–5.3)
Sodium: 139 mmol/L (ref 135–146)
Total Bilirubin: 0.6 mg/dL (ref 0.2–1.2)
Total Protein: 7.5 g/dL (ref 6.1–8.1)

## 2019-10-27 LAB — CBC WITH DIFFERENTIAL/PLATELET
Absolute Monocytes: 541 cells/uL (ref 200–950)
Basophils Absolute: 30 cells/uL (ref 0–200)
Basophils Relative: 0.9 %
Eosinophils Absolute: 182 cells/uL (ref 15–500)
Eosinophils Relative: 5.5 %
HCT: 41.9 % (ref 38.5–50.0)
Hemoglobin: 13.8 g/dL (ref 13.2–17.1)
Lymphs Abs: 739 cells/uL — ABNORMAL LOW (ref 850–3900)
MCH: 28.2 pg (ref 27.0–33.0)
MCHC: 32.9 g/dL (ref 32.0–36.0)
MCV: 85.5 fL (ref 80.0–100.0)
MPV: 11 fL (ref 7.5–12.5)
Monocytes Relative: 16.4 %
Neutro Abs: 1808 cells/uL (ref 1500–7800)
Neutrophils Relative %: 54.8 %
Platelets: 125 10*3/uL — ABNORMAL LOW (ref 140–400)
RBC: 4.9 10*6/uL (ref 4.20–5.80)
RDW: 13.1 % (ref 11.0–15.0)
Total Lymphocyte: 22.4 %
WBC: 3.3 10*3/uL — ABNORMAL LOW (ref 3.8–10.8)

## 2019-10-27 LAB — URINALYSIS
Bilirubin Urine: NEGATIVE
Glucose, UA: NEGATIVE
Ketones, ur: NEGATIVE
Nitrite: NEGATIVE
Specific Gravity, Urine: 1.023 (ref 1.001–1.03)
pH: 7 (ref 5.0–8.0)

## 2019-10-27 LAB — LIPID PANEL
Cholesterol: 143 mg/dL (ref <200)
HDL: 39 mg/dL — ABNORMAL LOW (ref >=40)
LDL Cholesterol (Calc): 86 mg/dL (calc)
Non-HDL Cholesterol (Calc): 104 mg/dL (calc) (ref <130)
Total CHOL/HDL Ratio: 3.7 (calc) (ref <5.0)
Triglycerides: 85 mg/dL (ref <150)

## 2019-10-27 LAB — HIV-1 RNA QUANT-NO REFLEX-BLD
HIV 1 RNA Quant: 7670 Copies/mL — ABNORMAL HIGH
HIV-1 RNA Quant, Log: 3.88 Log cps/mL — ABNORMAL HIGH

## 2019-10-27 LAB — FLUORESCENT TREPONEMAL AB(FTA)-IGG-BLD: Fluorescent Treponemal ABS: REACTIVE — AB

## 2019-10-27 LAB — RPR TITER: RPR Titer: 1:16 {titer} — ABNORMAL HIGH

## 2019-10-27 LAB — RPR: RPR Ser Ql: REACTIVE — AB

## 2019-11-01 ENCOUNTER — Telehealth: Payer: Self-pay | Admitting: Pharmacy Technician

## 2019-11-01 ENCOUNTER — Telehealth: Payer: Self-pay | Admitting: *Deleted

## 2019-11-01 DIAGNOSIS — Z8619 Personal history of other infectious and parasitic diseases: Secondary | ICD-10-CM

## 2019-11-01 DIAGNOSIS — B20 Human immunodeficiency virus [HIV] disease: Secondary | ICD-10-CM

## 2019-11-01 NOTE — Telephone Encounter (Signed)
[facility] Patient Advocate Encounter   Was successful in obtaining a [employer] copay card for [employer].  This copay card will make the patients copay $0.          Was successful in obtaining a Viiv copay card for Tivicay.  This copay card will make the patients copay $0.  I have spoken with the patient.       Netty Starring. Dimas Aguas CPhT [facility] Phone: [phone] Fax:  [phone]

## 2019-11-01 NOTE — Telephone Encounter (Signed)
Albert at [facility] calling about RPR result from 9/13, wants to know if this needs treatment per provider. Please advise. Andree Coss, RN

## 2019-11-02 NOTE — Addendum Note (Signed)
Addended by: Andree Coss on: 11/02/2019 02:28 PM   Modules accepted: Orders

## 2019-11-02 NOTE — Telephone Encounter (Signed)
Orders placed, [facility] notified of plan. Andree Coss, RN

## 2019-11-02 NOTE — Telephone Encounter (Signed)
Unless [facility] has another RPR on file I only see one from 2017 that was 1:128. Technically he has had a 4-fold reduction since then indicating successful treatment; although It is hard to say definitively there is no new infection since there has been 4 years in between levels.   I would suspect he is serofast and needs no further treatment - I would probably repeat RPR in 1 month to get a trend at his upcoming appointment. We should probably call the patient to see if any risk for exposure or new symptoms the last 6 months that would warrant presumptive treatment.

## 2019-11-29 ENCOUNTER — Encounter: Payer: Self-pay | Admitting: Internal Medicine

## 2019-11-29 ENCOUNTER — Ambulatory Visit: Payer: BC Managed Care – PPO | Admitting: Internal Medicine

## 2019-11-29 ENCOUNTER — Other Ambulatory Visit: Payer: Self-pay

## 2019-11-29 VITALS — BP 142/95 | HR 66 | Temp 97.9°F | Wt 139.0 lb

## 2019-11-29 DIAGNOSIS — B029 Zoster without complications: Secondary | ICD-10-CM

## 2019-11-29 DIAGNOSIS — B20 Human immunodeficiency virus [HIV] disease: Secondary | ICD-10-CM | POA: Diagnosis not present

## 2019-11-29 DIAGNOSIS — R64 Cachexia: Secondary | ICD-10-CM

## 2019-11-29 DIAGNOSIS — Z8619 Personal history of other infectious and parasitic diseases: Secondary | ICD-10-CM

## 2019-11-29 NOTE — Progress Notes (Signed)
Patient ID: Derek Donovan, male   DOB: May 23, 1965, 54 y.o.   MRN: [id number]  HPI 54yo M with HIV disease/AIDS, just recently back on ART, on tivicay-symtuza. Also had zoster and hx of syphilis.  Put on a weight! In 1 month Shingles resolving still some scarring to left axilla- not having any post herpetic pain No signs of thrush  Was accidentally on valtrex for 1 month  Feels like he has more energy No change in urine output  Had flu shot 1 week ago at work Outpatient Encounter Medications as of 11/29/2019  Medication Sig  . Darunavir-Cobicisctat-Emtricitabine-Tenofovir Alafenamide (SYMTUZA) 800-150-200-10 MG TABS Take 1 tablet by mouth daily with breakfast.  . dolutegravir (TIVICAY) 50 MG tablet Take 1 tablet (50 mg total) by mouth daily.  Marland Kitchen sulfamethoxazole-trimethoprim (BACTRIM DS) 800-160 MG tablet Take 1 tablet by mouth daily.  . valACYclovir (VALTREX) 1000 MG tablet Take 1 tablet (1,000 mg total) by mouth 3 (three) times daily.   No facility-administered encounter medications on file as of 11/29/2019.     Patient Active Problem List   Diagnosis Date Noted  . Syphilis of central nervous system 01/24/2015  . Pink eye disease of left eye 01/23/2015  . Syphilis 01/23/2015  . Rash 01/23/2015  . Human immunodeficiency virus (HIV) disease (HCC) 04/23/2010  . MONILIASIS, ORAL 04/23/2010  . TOBACCO USE 04/23/2010  . SKIN LESION 04/23/2010  . CACHEXIA 04/23/2010     Health Maintenance Due  Topic Date Due  . TETANUS/TDAP  Never done  . COLONOSCOPY  Never done  . INFLUENZA VACCINE  09/12/2019     Review of Systems Review of Systems  Constitutional: Negative for fever, chills, diaphoresis, activity change, appetite change, fatigue and unexpected weight change.  HENT: Negative for congestion, sore throat, rhinorrhea, sneezing, trouble swallowing and sinus pressure.  Eyes: Negative for photophobia and visual disturbance.  Respiratory: Negative for cough, chest  tightness, shortness of breath, wheezing and stridor.  Cardiovascular: Negative for chest pain, palpitations and leg swelling.  Gastrointestinal: Negative for nausea, vomiting, abdominal pain, diarrhea, constipation, blood in stool, abdominal distention and anal bleeding.  Genitourinary: Negative for dysuria, hematuria, flank pain and difficulty urinating.  Musculoskeletal: Negative for myalgias, back pain, joint swelling, arthralgias and gait problem.  Skin: Negative for color change, pallor, rash and wound.  Neurological: Negative for dizziness, tremors, weakness and light-headedness.  Hematological: Negative for adenopathy. Does not bruise/bleed easily.  Psychiatric/Behavioral: Negative for behavioral problems, confusion, sleep disturbance, dysphoric mood, decreased concentration and agitation.    Physical Exam   BP (!) 142/95   Pulse 66   Temp 97.9 F (36.6 C) (Oral)   Wt 139 lb (63 kg)   BMI 22.44 kg/m   Physical Exam  Constitutional: He is oriented to person, place, and time. He appears well-developed and well-nourished. No distress.  HENT:  Mouth/Throat: Oropharynx is clear and moist. No oropharyngeal exudate.  Cardiovascular: Normal rate, regular rhythm and normal heart sounds. Exam reveals no gallop and no friction rub.  No murmur heard.  Pulmonary/Chest: Effort normal and breath sounds normal. No respiratory distress. He has no wheezes.  Abdominal: Soft. Bowel sounds are normal. He exhibits no distension. There is no tenderness.  Lymphadenopathy:  He has no cervical adenopathy.  Neurological: He is alert and oriented to person, place, and time.  Skin: Skin is warm and dry. No rash noted. No erythema. Hyperpigment macules on left axillary region Psychiatric: He has a normal mood and affect. His behavior is normal.  Lab Results  Component Value Date   CD4TCELL 1 (L) 10/25/2019   Lab Results  Component Value Date   CD4TABS <35 (L) 10/25/2019   CD4TABS 240 (L)  05/01/2015   CD4TABS 90 (L) 11/28/2014   Lab Results  Component Value Date   HIV1RNAQUANT 7,670 (H) 10/25/2019   Lab Results  Component Value Date   HEPBSAB POS (A) 10/03/2011   Lab Results  Component Value Date   LABRPR REACTIVE (A) 10/25/2019    CBC Lab Results  Component Value Date   WBC 3.3 (L) 10/25/2019   RBC 4.90 10/25/2019   HGB 13.8 10/25/2019   HCT 41.9 10/25/2019   PLT 125 (L) 10/25/2019   MCV 85.5 10/25/2019   MCH 28.2 10/25/2019   MCHC 32.9 10/25/2019   RDW 13.1 10/25/2019   LYMPHSABS 739 (L) 10/25/2019   MONOABS 0.6 05/01/2015   EOSABS 182 10/25/2019    BMET Lab Results  Component Value Date   NA 139 10/25/2019   K 4.4 10/25/2019   CL 105 10/25/2019   CO2 30 10/25/2019   GLUCOSE 95 10/25/2019   BUN 16 10/25/2019   CREATININE 1.26 10/25/2019   CALCIUM 9.0 10/25/2019   GFRNONAA 64 10/25/2019   GFRAA 74 10/25/2019      Assessment and Plan  hiv disease = will check labs to see if undetectable; continue on current regimen  ckd 2 = will check cr since he has been on valtrex longer than initially planned  Health maintenance = Has covid booster this Friday at 10:15am  Zoster = no longer needs valtrex Cachexia = improving  Hx of syphilis = unsure if 1:16 is his new serofast state. Will recheck RPR if still that elevated, will give 1 shot of IM PCN

## 2019-11-30 LAB — T-HELPER CELL (CD4) - (RCID CLINIC ONLY)
CD4 % Helper T Cell: 2 % — ABNORMAL LOW (ref 33–65)
CD4 T Cell Abs: <35 /uL — ABNORMAL LOW (ref 400–1790)

## 2019-12-02 LAB — BASIC METABOLIC PANEL
BUN: 17 mg/dL (ref 7–25)
CO2: 28 mmol/L (ref 20–32)
Calcium: 9.3 mg/dL (ref 8.6–10.3)
Chloride: 104 mmol/L (ref 98–110)
Creat: 1.2 mg/dL (ref 0.70–1.33)
Glucose, Bld: 75 mg/dL (ref 65–99)
Potassium: 3.9 mmol/L (ref 3.5–5.3)
Sodium: 137 mmol/L (ref 135–146)

## 2019-12-02 LAB — FLUORESCENT TREPONEMAL AB(FTA)-IGG-BLD: Fluorescent Treponemal ABS: REACTIVE — AB

## 2019-12-02 LAB — HIV-1 RNA QUANT-NO REFLEX-BLD
HIV 1 RNA Quant: 74 Copies/mL — ABNORMAL HIGH
HIV-1 RNA Quant, Log: 1.87 Log cps/mL — ABNORMAL HIGH

## 2019-12-02 LAB — RPR: RPR Ser Ql: REACTIVE — AB

## 2019-12-02 LAB — RPR TITER: RPR Titer: 1:16 {titer} — ABNORMAL HIGH

## 2019-12-03 ENCOUNTER — Other Ambulatory Visit: Payer: Self-pay

## 2019-12-03 ENCOUNTER — Ambulatory Visit (INDEPENDENT_AMBULATORY_CARE_PROVIDER_SITE_OTHER): Payer: BC Managed Care – PPO

## 2019-12-03 DIAGNOSIS — Z23 Encounter for immunization: Secondary | ICD-10-CM

## 2019-12-03 NOTE — Progress Notes (Signed)
   Covid-19 Vaccination Clinic  Name:  Derek Donovan    MRN: [id number] DOB: Jan 30, 1966  12/03/2019  Mr. Derek Donovan was observed post Covid-19 immunization for 15 minutes  without incident. He was provided with Vaccine Information Sheet and instruction to access the V-Safe system.   Mr. Derek Donovan was instructed to call 911 with any severe reactions post vaccine: Marland Kitchen Difficulty breathing  . Swelling of face and throat  . A fast heartbeat  . A bad rash all over body  . Dizziness and weakness     [employer]

## 2020-01-31 ENCOUNTER — Ambulatory Visit: Payer: BC Managed Care – PPO | Admitting: Internal Medicine

## 2020-01-31 ENCOUNTER — Other Ambulatory Visit: Payer: Self-pay

## 2020-01-31 ENCOUNTER — Encounter: Payer: Self-pay | Admitting: Internal Medicine

## 2020-01-31 VITALS — BP 107/76 | HR 94 | Temp 98.0°F | Ht 66.0 in | Wt 137.0 lb

## 2020-01-31 DIAGNOSIS — B20 Human immunodeficiency virus [HIV] disease: Secondary | ICD-10-CM

## 2020-01-31 DIAGNOSIS — Z23 Encounter for immunization: Secondary | ICD-10-CM

## 2020-01-31 DIAGNOSIS — B029 Zoster without complications: Secondary | ICD-10-CM

## 2020-01-31 MED ORDER — AZITHROMYCIN 600 MG PO TABS: 1200.0000 mg | ORAL_TABLET | ORAL | 0 refills | Status: DC

## 2020-01-31 MED ORDER — AZITHROMYCIN 600 MG PO TABS
1200.0000 mg | ORAL_TABLET | ORAL | 0 refills | Status: DC
Start: 2020-01-31 — End: 2020-02-15

## 2020-01-31 NOTE — Progress Notes (Signed)
RFV: follow up for hiv disease  Patient ID: Derek Donovan, male   DOB: [birth year], 54 y.o.   MRN: [id number]  HPI 53yo M CD 4 count of <35 with VL of 75, he reports that he has had some gaps getting his meds mailed. He continues to take tivicay,symtuza and bactrim for oi prophylaxis. He states that he no longer has pain from shingles. He is still taking valtrex. No longer has signs of thrush on his tongue  Outpatient Encounter Medications as of 01/31/2020  Medication Sig  . Darunavir-Cobicisctat-Emtricitabine-Tenofovir Alafenamide (SYMTUZA) 800-150-200-10 MG TABS Take 1 tablet by mouth daily with breakfast.  . dolutegravir (TIVICAY) 50 MG tablet Take 1 tablet (50 mg total) by mouth daily.  Marland Kitchen sulfamethoxazole-trimethoprim (BACTRIM DS) 800-160 MG tablet Take 1 tablet by mouth daily.  . valACYclovir (VALTREX) 1000 MG tablet Take 1 tablet (1,000 mg total) by mouth 3 (three) times daily.   No facility-administered encounter medications on file as of 01/31/2020.     Patient Active Problem List   Diagnosis Date Noted  . Syphilis of central nervous system 01/24/2015  . Pink eye disease of left eye 01/23/2015  . Syphilis 01/23/2015  . Rash 01/23/2015  . Human immunodeficiency virus (HIV) disease (HCC) 04/23/2010  . MONILIASIS, ORAL 04/23/2010  . TOBACCO USE 04/23/2010  . SKIN LESION 04/23/2010  . CACHEXIA 04/23/2010     Health Maintenance Due  Topic Date Due  . TETANUS/TDAP  Never done  . COLONOSCOPY  Never done  . INFLUENZA VACCINE  09/12/2019    Social History   Tobacco Use  . Smoking status: Current Every Day Smoker    Packs/day: 0.50    Years: 10.00    Pack years: 5.00  . Smokeless tobacco: Never Used  Substance Use Topics  . Alcohol use: Yes    Alcohol/week: 1.0 standard drink    Types: 1 Cans of beer per week    Comment: occ  . Drug use: Yes    Types: Marijuana    Comment: occassional    Review of Systems Review of Systems  Constitutional: Negative for fever,  chills, diaphoresis, activity change, appetite change, fatigue and unexpected weight change.  HENT: Negative for congestion, sore throat, rhinorrhea, sneezing, trouble swallowing and sinus pressure.  Eyes: Negative for photophobia and visual disturbance.  Respiratory: Negative for cough, chest tightness, shortness of breath, wheezing and stridor.  Cardiovascular: Negative for chest pain, palpitations and leg swelling.  Gastrointestinal: Negative for nausea, vomiting, abdominal pain, diarrhea, constipation, blood in stool, abdominal distention and anal bleeding.  Genitourinary: Negative for dysuria, hematuria, flank pain and difficulty urinating.  Musculoskeletal: Negative for myalgias, back pain, joint swelling, arthralgias and gait problem.  Skin: Negative for color change, pallor, rash and wound.  Neurological: Negative for dizziness, tremors, weakness and light-headedness.  Hematological: Negative for adenopathy. Does not bruise/bleed easily.  Psychiatric/Behavioral: Negative for behavioral problems, confusion, sleep disturbance, dysphoric mood, decreased concentration and agitation.    Physical Exam   BP 107/76   Pulse 94   Temp 98 F (36.7 C)   Ht 5\' 6"  (1.676 m)   Wt 137 lb (62.1 kg)   BMI 22.11 kg/m   Physical Exam  Constitutional: He is oriented to person, place, and time. He appears well-developed and well-nourished. No distress.  HENT:  Mouth/Throat: Oropharynx is clear and moist. No oropharyngeal exudate.  Cardiovascular: Normal rate, regular rhythm and normal heart sounds. Exam reveals no gallop and no friction rub.  No murmur heard.  Pulmonary/Chest:  Effort normal and breath sounds normal. No respiratory distress. He has no wheezes.  Abdominal: Soft. Bowel sounds are normal. He exhibits no distension. There is no tenderness.  Lymphadenopathy:  He has no cervical adenopathy.  Neurological: He is alert and oriented to person, place, and time.  Skin: Skin is warm and dry.  Healing scars on chest from shingles Psychiatric: He has a normal mood and affect. His behavior is normal.    Lab Results  Component Value Date   CD4TCELL 2 (L) 11/29/2019   Lab Results  Component Value Date   CD4TABS <35 (L) 11/29/2019   CD4TABS <35 (L) 10/25/2019   CD4TABS 240 (L) 05/01/2015   Lab Results  Component Value Date   HIV1RNAQUANT 74 (H) 11/29/2019   Lab Results  Component Value Date   HEPBSAB POS (A) 10/03/2011   Lab Results  Component Value Date   LABRPR REACTIVE (A) 11/29/2019    CBC Lab Results  Component Value Date   WBC 3.3 (L) 10/25/2019   RBC 4.90 10/25/2019   HGB 13.8 10/25/2019   HCT 41.9 10/25/2019   PLT 125 (L) 10/25/2019   MCV 85.5 10/25/2019   MCH 28.2 10/25/2019   MCHC 32.9 10/25/2019   RDW 13.1 10/25/2019   LYMPHSABS 739 (L) 10/25/2019   MONOABS 0.6 05/01/2015   EOSABS 182 10/25/2019    BMET Lab Results  Component Value Date   NA 137 11/29/2019   K 3.9 11/29/2019   CL 104 11/29/2019   CO2 28 11/29/2019   GLUCOSE 75 11/29/2019   BUN 17 11/29/2019   CREATININE 1.20 11/29/2019   CALCIUM 9.3 11/29/2019   GFRNONAA 64 10/25/2019   GFRAA 74 10/25/2019      Assessment and Plan  hiv disease = will check CD 4 count and VL to see if undetectable. Plan on continuing withTividay, and symtuza. Spent 5 min on importance of medication adherence  oi proph taking bactrim; because < 35 will need weekly azithromycin  Shingles = recovered from it no pain. Valtrex can stop  Health maintenance = got booster pfizer and flu vaccine

## 2020-01-31 NOTE — Progress Notes (Signed)
Patient states he has unable to access his medication with the pharmacy for 3 weeks now. Patient has [facility] and is required to use [facility] pharmacy for speciality medications.  Patient provided staff with co-pay assistance cards and staff called Pharmacy and provided with BIN/Goup/ID numbers. Co-pay assistance  Cards now on file with speciality pharmacy and scheduled for delivery 02/03/20. Patient will pick up other medication at [facility] location. Patient made aware.  Valarie Cones

## 2020-02-01 LAB — T-HELPER CELL (CD4) - (RCID CLINIC ONLY)
CD4 % Helper T Cell: 2 % — ABNORMAL LOW (ref 33–65)
CD4 T Cell Abs: <35 /uL — ABNORMAL LOW (ref 400–1790)

## 2020-02-04 LAB — HIV-1 RNA QUANT-NO REFLEX-BLD
HIV 1 RNA Quant: 89300 Copies/mL — ABNORMAL HIGH
HIV-1 RNA Quant, Log: 4.95 Log cps/mL — ABNORMAL HIGH

## 2020-02-15 ENCOUNTER — Other Ambulatory Visit: Payer: Self-pay | Admitting: *Deleted

## 2020-02-15 DIAGNOSIS — B20 Human immunodeficiency virus [HIV] disease: Secondary | ICD-10-CM

## 2020-02-15 MED ORDER — AZITHROMYCIN 600 MG PO TABS: 1200.0000 mg | ORAL_TABLET | ORAL | 0 refills | Status: DC

## 2020-02-15 NOTE — Progress Notes (Signed)
Received request to send azithromycin prescription to [facility] service due to patient's insurance. Andree Coss, RN

## 2020-02-22 NOTE — Progress Notes (Signed)
Received rejection from [facility] for patient's azithromycin, as this is not a specialty drug. Patient must fill at a different pharmacy. RN called patient to see which pharmacy he would like me to send the refill to. Andree Coss, RN

## 2020-03-10 ENCOUNTER — Other Ambulatory Visit: Payer: BC Managed Care – PPO

## 2020-03-10 DIAGNOSIS — Z20822 Contact with and (suspected) exposure to covid-19: Secondary | ICD-10-CM | POA: Diagnosis not present

## 2020-03-11 LAB — NOVEL CORONAVIRUS, NAA: SARS-CoV-2, NAA: NOT DETECTED

## 2020-03-11 LAB — SARS-COV-2, NAA 2 DAY TAT

## 2020-04-10 ENCOUNTER — Ambulatory Visit: Payer: BC Managed Care – PPO | Admitting: Internal Medicine

## 2020-05-08 ENCOUNTER — Ambulatory Visit: Payer: BC Managed Care – PPO | Admitting: Internal Medicine

## 2020-05-08 ENCOUNTER — Telehealth: Payer: Self-pay

## 2020-05-08 NOTE — Telephone Encounter (Signed)
I attempted to reach out to patient in regards to his missed appointment with Dr. Drue Second today.  Patient did not answer and a voicemail was left for patient to call back. Shaune Malacara T Pricilla Loveless

## 2020-07-24 ENCOUNTER — Other Ambulatory Visit: Payer: Self-pay | Admitting: Family

## 2020-07-24 DIAGNOSIS — B20 Human immunodeficiency virus [HIV] disease: Secondary | ICD-10-CM

## 2020-07-24 NOTE — Progress Notes (Unsigned)
c 

## 2020-07-27 ENCOUNTER — Telehealth: Payer: Self-pay

## 2020-07-27 NOTE — Telephone Encounter (Signed)
Called patient to set up follow up appointment, no answer and no secure voicemail set up.   Sandie Ano, RN

## 2020-08-02 ENCOUNTER — Other Ambulatory Visit (HOSPITAL_BASED_OUTPATIENT_CLINIC_OR_DEPARTMENT_OTHER): Payer: Self-pay

## 2020-08-02 ENCOUNTER — Ambulatory Visit: Payer: Self-pay | Attending: Internal Medicine

## 2020-08-02 DIAGNOSIS — Z23 Encounter for immunization: Secondary | ICD-10-CM

## 2020-08-02 MED ORDER — PFIZER-BIONT COVID-19 VAC-TRIS 30 MCG/0.3ML IM SUSP
INTRAMUSCULAR | 0 refills | Status: DC
  Filled 2020-08-02: qty 0.3, 1d supply, fill #0

## 2020-08-02 NOTE — Progress Notes (Signed)
   Covid-19 Vaccination Clinic  Name:  Derek Donovan    MRN: [id number] DOB: 04/23/1965  08/02/2020  Derek Donovan was observed post Covid-19 immunization for 15 minutes without incident. He was provided with Vaccine Information Sheet and instruction to access the V-Safe system.   Derek Donovan was instructed to call 911 with any severe reactions post vaccine: Difficulty breathing  Swelling of face and throat  A fast heartbeat  A bad rash all over body  Dizziness and weakness   Immunizations Administered     Name Date Dose VIS Date Route   PFIZER Comrnaty(Gray TOP) Covid-19 Vaccine 08/02/2020 10:42 AM 0.3 mL 01/20/2020 Intramuscular   Manufacturer: [employer], [employer]   Lot: [id number]   NDC: [phone]

## 2020-11-09 ENCOUNTER — Other Ambulatory Visit: Payer: Self-pay

## 2020-11-09 ENCOUNTER — Ambulatory Visit: Payer: BC Managed Care – PPO | Admitting: Infectious Diseases

## 2020-11-09 ENCOUNTER — Other Ambulatory Visit (HOSPITAL_COMMUNITY): Payer: Self-pay

## 2020-11-09 ENCOUNTER — Encounter: Payer: Self-pay | Admitting: Infectious Diseases

## 2020-11-09 VITALS — BP 119/79 | HR 90 | Temp 98.4°F | Wt 128.2 lb

## 2020-11-09 DIAGNOSIS — B20 Human immunodeficiency virus [HIV] disease: Secondary | ICD-10-CM | POA: Diagnosis not present

## 2020-11-09 DIAGNOSIS — F32A Depression, unspecified: Secondary | ICD-10-CM

## 2020-11-09 DIAGNOSIS — A5149 Other secondary syphilitic conditions: Secondary | ICD-10-CM | POA: Diagnosis not present

## 2020-11-09 DIAGNOSIS — Z23 Encounter for immunization: Secondary | ICD-10-CM | POA: Diagnosis not present

## 2020-11-09 MED ORDER — PENICILLIN G BENZATHINE 1200000 UNIT/2ML IM SUSY
1.2000 10*6.[IU] | PREFILLED_SYRINGE | Freq: Once | INTRAMUSCULAR | Status: AC
  Administered 2020-11-09: 1.2 10*6.[IU] via INTRAMUSCULAR

## 2020-11-09 MED ORDER — TIVICAY 50 MG PO TABS
50.0000 mg | ORAL_TABLET | Freq: Every day | ORAL | 5 refills | Status: DC
  Filled 2020-11-09 – 2021-01-29 (×3): qty 30, 30d supply, fill #0

## 2020-11-09 MED ORDER — PENICILLIN G BENZATHINE 1200000 UNIT/2ML IM SUSY
1.2000 10*6.[IU] | PREFILLED_SYRINGE | Freq: Once | INTRAMUSCULAR | Status: AC
Start: 2020-11-09 — End: 2020-11-09
  Administered 2020-11-09: 1.2 10*6.[IU] via INTRAMUSCULAR

## 2020-11-09 MED ORDER — SULFAMETHOXAZOLE-TRIMETHOPRIM 800-160 MG PO TABS
1.0000 | ORAL_TABLET | Freq: Every day | ORAL | 6 refills | Status: DC
  Filled 2020-11-09 – 2021-01-16 (×2): qty 30, 30d supply, fill #0

## 2020-11-09 MED ORDER — SYMTUZA 800-150-200-10 MG PO TABS
1.0000 | ORAL_TABLET | Freq: Every day | ORAL | 5 refills | Status: DC
  Filled 2020-11-09 – 2021-01-29 (×3): qty 30, 30d supply, fill #0

## 2020-11-09 NOTE — Patient Instructions (Addendum)
It was a pleasure meeting you today and cheers to trying something different!  Pleas start back your medications every morning with breakfast. I would like to use a new pharmacy to help mail your medications to you - please call them to set this up and verify your address.  Symtuza  Tivicay Bactrim  I would like for you to start working with [facility] for counseling - please call for a new patient appointment at your ability [phone].   We gave you your flu shot today.   Please come back to see me again at the end of December so we can see how things are going for you!

## 2020-11-09 NOTE — Assessment & Plan Note (Signed)
Scaled hyperpigmented macular rashes to bilateral palms and soles of feet with genital ulceration. Suspect syphilis infection - will give 2.4 million units of bicillin for what seems to be primary/secondary syphilis infection. Check RPR today.

## 2020-11-09 NOTE — Addendum Note (Signed)
Addended by: Marcell Anger on: 11/09/2020 02:44 PM   Modules accepted: Orders

## 2020-11-09 NOTE — Assessment & Plan Note (Signed)
We spent a lot of time discussing background of depression cycles. He certainly has seasonal patterns. I think he would greatly benefit from counseling appointments - [facility]. I am hopeful he will improve with these. Will touch base on this with him at return visit and see if he needs any medication for treatment.

## 2020-11-09 NOTE — Progress Notes (Signed)
Name: Derek Donovan  DOB: 11/03/1965 MRN: [id number] PCP: Patient, No Pcp Per (Inactive)     Subjective:   Chief Complaint  Patient presents with   Follow-up    B20  - c/o rash on hands and feet x 1 month. Pt states that the rash itches at times. Pt reports applying coco butter lotion. Pt reports being out of meds x 4 months.       HPI: Derek Donovan is here today for follow up. He has had a new rash that is dry and flaking on the palms of his hands and feet that has been present off and on now for a few months. Also has a ulcer on the bottom of his scrotum that is still present today. He has had new sexual partners the last 73m.   He has had a hard time staying on his medications for treatment. Typically will do OK then depression gets to him and he stops doing what he should for his health. He goes into detail about this more today about previous attempts to get his medications regularly.   Depression has always been off and on problem for him - gets worse over the winter months when he feels lonely and not around many of his friends. He has never had any counseling and treatment for this in the past.   Reports no complaints today suggestive of associated opportunistic infection or advancing HIV disease such as fevers, night sweats, weight loss, anorexia, cough, SOB, nausea, vomiting, diarrhea, headache, sensory changes, lymphadenopathy or oral thrush.     Depression screen PHQ 2/9 11/09/2020  Decreased Interest 0  Down, Depressed, Hopeless 2  PHQ - 2 Score 2  Altered sleeping -  Tired, decreased energy -  Change in appetite -  Feeling bad or failure about yourself  -  Trouble concentrating -  Moving slowly or fidgety/restless -  Suicidal thoughts -  PHQ-9 Score -  Difficult doing work/chores -  Some encounter information is confidential and restricted. Go to Review Flowsheets activity to see all data.    Review of Systems  Constitutional:  Positive for weight loss. Negative for  chills, fever and malaise/fatigue.  HENT:  Negative for sore throat.        No dental problems  Respiratory:  Negative for cough and sputum production.   Cardiovascular:  Negative for chest pain and leg swelling.  Gastrointestinal:  Negative for abdominal pain, diarrhea and vomiting.  Genitourinary:  Negative for dysuria and flank pain.  Musculoskeletal:  Negative for joint pain, myalgias and neck pain.  Skin:  Positive for rash.  Neurological:  Negative for dizziness, tingling and headaches.  Psychiatric/Behavioral:  Positive for depression. Negative for substance abuse. The patient is not nervous/anxious and does not have insomnia.    Past Medical History:  Diagnosis Date   HIV (human immunodeficiency virus infection) (HCC)    HIV infection (HCC)     Outpatient Medications Prior to Visit  Medication Sig Dispense Refill   azithromycin (ZITHROMAX) 600 MG tablet Take 2 tablets (1,200 mg total) by mouth every 7 (seven) days. (Patient not taking: Reported on 11/09/2020) 24 tablet 0   COVID-19 mRNA Vac-TriS, Pfizer, (PFIZER-BIONT COVID-19 VAC-TRIS) SUSP injection Inject into the muscle. (Patient not taking: Reported on 11/09/2020) 0.3 mL 0   Darunavir-Cobicisctat-Emtricitabine-Tenofovir Alafenamide (SYMTUZA) 800-150-200-10 MG TABS Take 1 tablet by mouth daily with breakfast. (Patient not taking: Reported on 11/09/2020) 30 tablet 5   dolutegravir (TIVICAY) 50 MG tablet Take 1 tablet (50  mg total) by mouth daily. (Patient not taking: Reported on 11/09/2020) 30 tablet 5   sulfamethoxazole-trimethoprim (BACTRIM DS) 800-160 MG tablet Take 1 tablet by mouth daily. (Patient not taking: Reported on 11/09/2020) 30 tablet 6   No facility-administered medications prior to visit.     Allergies  Allergen Reactions   Epinephrine Shortness Of Breath    Social History   Tobacco Use   Smoking status: Every Day    Packs/day: 0.50    Years: 10.00    Pack years: 5.00    Types: Cigarettes   Smokeless  tobacco: Never  Substance Use Topics   Alcohol use: Yes    Alcohol/week: 1.0 standard drink    Types: 1 Cans of beer per week    Comment: occ   Drug use: Yes    Types: Marijuana    Comment: occassional     Family History  Problem Relation Age of Onset   Hypertension Mother     Social History   Substance and Sexual Activity  Sexual Activity Not Currently   Partners: Female, Male   Comment: given condoms     Objective:   Vitals:   11/09/20 1341  BP: 119/79  Pulse: 90  Temp: 98.4 F (36.9 C)  TempSrc: Oral  SpO2: 99%  Weight: 128 lb 3.2 oz (58.2 kg)   Body mass index is 20.69 kg/m.  Physical Exam Vitals reviewed.  Constitutional:      Appearance: Normal appearance. He is not ill-appearing.  HENT:     Head: Normocephalic.     Mouth/Throat:     Mouth: Mucous membranes are moist.     Pharynx: Oropharynx is clear.  Eyes:     General: No scleral icterus. Cardiovascular:     Rate and Rhythm: Normal rate and regular rhythm.  Pulmonary:     Effort: Pulmonary effort is normal.  Abdominal:     General: There is no distension.     Palpations: Abdomen is soft.  Musculoskeletal:        General: Normal range of motion.     Cervical back: Normal range of motion.  Skin:    General: Skin is warm and dry.     Capillary Refill: Capillary refill takes less than 2 seconds.     Coloration: Skin is not jaundiced or pale.     Comments: Scaling rash to palms with hyperpigmentation macules noted to bilateral palms.   Neurological:     Mental Status: He is alert and oriented to person, place, and time.  Psychiatric:        Mood and Affect: Mood normal.        Judgment: Judgment normal.      Lab Results Lab Results  Component Value Date   WBC 3.3 (L) 10/25/2019   HGB 13.8 10/25/2019   HCT 41.9 10/25/2019   MCV 85.5 10/25/2019   PLT 125 (L) 10/25/2019    Lab Results  Component Value Date   CREATININE 1.20 11/29/2019   BUN 17 11/29/2019   NA 137 11/29/2019   K  3.9 11/29/2019   CL 104 11/29/2019   CO2 28 11/29/2019    Lab Results  Component Value Date   ALT 8 (L) 10/25/2019   AST 15 10/25/2019   ALKPHOS 72 05/01/2015   BILITOT 0.6 10/25/2019    Lab Results  Component Value Date   CHOL 143 10/25/2019   HDL 39 (L) 10/25/2019   LDLCALC 86 10/25/2019   TRIG 85 10/25/2019   CHOLHDL 3.7  10/25/2019   HIV 1 RNA Quant (Copies/mL)  Date Value  01/31/2020 89,300 (H)  11/29/2019 74 (H)  10/25/2019 7,670 (H)   CD4 T Cell Abs (/uL)  Date Value  01/31/2020 <35 (L)  11/29/2019 <35 (L)  10/25/2019 <35 (L)     Assessment & Plan:   Problem List Items Addressed This Visit       Unprioritized   Secondary syphilis    Scaled hyperpigmented macular rashes to bilateral palms and soles of feet with genital ulceration. Suspect syphilis infection - will give 2.4 million units of bicillin for what seems to be primary/secondary syphilis infection. Check RPR today.       Relevant Medications   dolutegravir (TIVICAY) 50 MG tablet   Darunavir-Cobicistat-Emtricitabine-Tenofovir Alafenamide (SYMTUZA) 800-150-200-10 MG TABS   sulfamethoxazole-trimethoprim (BACTRIM DS) 800-160 MG tablet   Other Relevant Orders   RPR   Human immunodeficiency virus (HIV) disease (HCC) - Primary    We reviewed historical labs together today. He has had trouble with staying on medications regularly and seems ready to do some things differently with his treatment. Will resume tivicay + symtuza with food once a day (he likes the idea of taking this everyday with breakfast to keep some consistency).  Will also put him back on bactrim once daily.  VL, CD4, CBC, CMP today.  Will send rx's to [facility] to help with mailing monthly so he does not need to rely on picking them up.  Return in about 3 months (around 02/08/2021).       Relevant Medications   dolutegravir (TIVICAY) 50 MG tablet   Darunavir-Cobicistat-Emtricitabine-Tenofovir Alafenamide (SYMTUZA) 800-150-200-10 MG TABS    sulfamethoxazole-trimethoprim (BACTRIM DS) 800-160 MG tablet   Other Relevant Orders   HIV RNA, RTPCR W/R GT (RTI, PI,INT)   T-helper cell (CD4)- ([facility] clinic only)   COMPLETE METABOLIC PANEL WITH GFR   CBC with Differential/Platelet   Depression    We spent a lot of time discussing background of depression cycles. He certainly has seasonal patterns. I think he would greatly benefit from counseling appointments - will [facility]. I am hopeful he will improve with these. Will touch base on this with him at return visit and see if he needs any medication for treatment.        Rexene Alberts, MSN, NP-C [facility] Pager: [phone] Office: [phone]  11/09/20  2:33 PM

## 2020-11-09 NOTE — Assessment & Plan Note (Addendum)
We reviewed historical labs together today. He has had trouble with staying on medications regularly and seems ready to do some things differently with his treatment. Will resume tivicay + symtuza with food once a day (he likes the idea of taking this everyday with breakfast to keep some consistency).  Will also put him back on bactrim once daily.  VL, CD4, CBC, CMP today.  Will send rx's to [facility] to help with mailing monthly so he does not need to rely on picking them up.  Return in about 3 months (around 02/08/2021).

## 2020-11-10 ENCOUNTER — Other Ambulatory Visit (HOSPITAL_COMMUNITY): Payer: Self-pay

## 2020-11-10 ENCOUNTER — Telehealth: Payer: Self-pay

## 2020-11-10 LAB — T-HELPER CELL (CD4) - (RCID CLINIC ONLY)
CD4 % Helper T Cell: 1 % — ABNORMAL LOW (ref 33–65)
CD4 T Cell Abs: <35 /uL — ABNORMAL LOW (ref 400–1790)

## 2020-11-10 MED ORDER — AZITHROMYCIN 600 MG PO TABS
1200.0000 mg | ORAL_TABLET | ORAL | 11 refills | Status: DC
  Filled 2020-11-10: qty 8, 28d supply, fill #0

## 2020-11-10 NOTE — Progress Notes (Signed)
Please call Derek Donovan to let him know he does indeed have a new syphilis infection. We did the right thing to treat him in clinic yesterday.  Nothing further is needed. The health department may be contacting him as they review all positive test results.

## 2020-11-10 NOTE — Addendum Note (Signed)
Addended by: Blanchard Kelch on: 11/10/2020 01:46 PM   Modules accepted: Orders

## 2020-11-10 NOTE — Telephone Encounter (Signed)
[facility] Patient Advocate Encounter   Was successful in obtaining a [employer] copay card for Symtuza .  This copay card will make the patients copay $0.00.  I have spoken with the patient.    The billing information is as follows and has been shared with [facility].    Kristene Liberati, CPhT Specialty Pharmacy Patient [facility] Phone: [phone] Fax:  [phone]  

## 2020-11-13 ENCOUNTER — Telehealth: Payer: Self-pay

## 2020-11-13 NOTE — Telephone Encounter (Signed)
-----   Message from Stephanie N Dixon, NP sent at 11/10/2020  2:23 PM EDT ----- Please call Derek Donovan to let him know he does indeed have a new syphilis infection. We did the right thing to treat him in clinic yesterday.  Nothing further is needed. The health department may be contacting him as they review all positive test results.   

## 2020-11-13 NOTE — Telephone Encounter (Signed)
Patient made aware of results. States he is feeling a lot better since treatment. Patient verbalized understanding that HD may contact him to review results.  Valarie Cones

## 2020-11-13 NOTE — Telephone Encounter (Signed)
-----   Message from Blanchard Kelch, NP sent at 11/10/2020  2:23 PM EDT ----- Please call Derek Donovan to let him know he does indeed have a new syphilis infection. We did the right thing to treat him in clinic yesterday.  Nothing further is needed. The health department may be contacting him as they review all positive test results.

## 2020-11-15 ENCOUNTER — Other Ambulatory Visit (HOSPITAL_COMMUNITY): Payer: Self-pay

## 2020-11-17 LAB — HIV-1 INTEGRASE GENOTYPE

## 2020-11-17 LAB — CBC WITH DIFFERENTIAL/PLATELET
Absolute Monocytes: 360 cells/uL (ref 200–950)
Basophils Absolute: 41 cells/uL (ref 0–200)
Basophils Relative: 0.9 %
Eosinophils Absolute: 468 cells/uL (ref 15–500)
Eosinophils Relative: 10.4 %
HCT: 39.8 % (ref 38.5–50.0)
Hemoglobin: 13.1 g/dL — ABNORMAL LOW (ref 13.2–17.1)
Lymphs Abs: 558 cells/uL — ABNORMAL LOW (ref 850–3900)
MCH: 28 pg (ref 27.0–33.0)
MCHC: 32.9 g/dL (ref 32.0–36.0)
MCV: 85 fL (ref 80.0–100.0)
MPV: 11 fL (ref 7.5–12.5)
Monocytes Relative: 8 %
Neutro Abs: 3074 cells/uL (ref 1500–7800)
Neutrophils Relative %: 68.3 %
Platelets: 173 10*3/uL (ref 140–400)
RBC: 4.68 10*6/uL (ref 4.20–5.80)
RDW: 13.1 % (ref 11.0–15.0)
Total Lymphocyte: 12.4 %
WBC: 4.5 10*3/uL (ref 3.8–10.8)

## 2020-11-17 LAB — COMPLETE METABOLIC PANEL WITH GFR
AG Ratio: 1 (calc) (ref 1.0–2.5)
ALT: 18 U/L (ref 9–46)
AST: 25 U/L (ref 10–35)
Albumin: 3.7 g/dL (ref 3.6–5.1)
Alkaline phosphatase (APISO): 101 U/L (ref 35–144)
BUN: 14 mg/dL (ref 7–25)
CO2: 26 mmol/L (ref 20–32)
Calcium: 8.1 mg/dL — ABNORMAL LOW (ref 8.6–10.3)
Chloride: 106 mmol/L (ref 98–110)
Creat: 1.05 mg/dL (ref 0.70–1.30)
Globulin: 3.8 g/dL (calc) — ABNORMAL HIGH (ref 1.9–3.7)
Glucose, Bld: 83 mg/dL (ref 65–99)
Potassium: 3.8 mmol/L (ref 3.5–5.3)
Sodium: 139 mmol/L (ref 135–146)
Total Bilirubin: 0.6 mg/dL (ref 0.2–1.2)
Total Protein: 7.5 g/dL (ref 6.1–8.1)
eGFR: 84 mL/min/{1.73_m2} (ref >=60)

## 2020-11-17 LAB — HIV RNA, RTPCR W/R GT (RTI, PI,INT)
HIV 1 RNA Quant: 150000 copies/mL — ABNORMAL HIGH
HIV-1 RNA Quant, Log: 5.18 Log copies/mL — ABNORMAL HIGH

## 2020-11-17 LAB — HIV-1 GENOTYPE: HIV-1 Genotype: DETECTED — AB

## 2020-11-17 LAB — FLUORESCENT TREPONEMAL AB(FTA)-IGG-BLD: Fluorescent Treponemal ABS: REACTIVE — AB

## 2020-11-17 LAB — RPR: RPR Ser Ql: REACTIVE — AB

## 2020-11-17 LAB — RPR TITER: RPR Titer: 1:256 {titer} — ABNORMAL HIGH

## 2020-11-20 ENCOUNTER — Other Ambulatory Visit (HOSPITAL_COMMUNITY): Payer: Self-pay

## 2021-01-16 ENCOUNTER — Telehealth: Payer: Self-pay

## 2021-01-16 ENCOUNTER — Other Ambulatory Visit (HOSPITAL_COMMUNITY): Payer: Self-pay

## 2021-01-16 NOTE — Telephone Encounter (Signed)
[facility] Patient Advocate Encounter  [facility] and I have been unsuccsessful in reaching patient to be able to refill medication.    We have tried multiple times without a response.  Ayiden Milliman, CPhT Specialty Pharmacy Patient [facility] Phone: [phone] Fax:  [phone]  

## 2021-01-29 ENCOUNTER — Other Ambulatory Visit (HOSPITAL_COMMUNITY): Payer: Self-pay

## 2021-01-30 ENCOUNTER — Other Ambulatory Visit (HOSPITAL_COMMUNITY): Payer: Self-pay

## 2021-02-01 ENCOUNTER — Ambulatory Visit: Payer: BC Managed Care – PPO | Admitting: Infectious Diseases

## 2021-02-19 ENCOUNTER — Ambulatory Visit: Payer: BC Managed Care – PPO | Admitting: Infectious Diseases

## 2021-02-20 ENCOUNTER — Other Ambulatory Visit (HOSPITAL_COMMUNITY): Payer: Self-pay

## 2021-02-26 ENCOUNTER — Other Ambulatory Visit (HOSPITAL_COMMUNITY): Payer: Self-pay

## 2021-03-30 ENCOUNTER — Telehealth: Payer: Self-pay

## 2021-03-30 NOTE — Telephone Encounter (Signed)
Called patient to offer follow up appointment, no answer and unable to leave message.   Beryle Flock, RN

## 2021-04-26 ENCOUNTER — Telehealth: Payer: Self-pay

## 2021-04-26 NOTE — Telephone Encounter (Signed)
Patient is overdue for labs and office visit. Attempted to contact patient today to offer appointments. Unable to leave voicemail at this time. Staff will also send a Mychart message.  ?Derek Donovan ? ?

## 2021-05-09 ENCOUNTER — Other Ambulatory Visit (HOSPITAL_COMMUNITY): Payer: Self-pay

## 2021-08-06 ENCOUNTER — Telehealth: Payer: Self-pay

## 2021-08-06 NOTE — Telephone Encounter (Signed)
Follow up call placed as patient is on viral load list. Patient overdue for labs and office visit. Staff was unable to reach patient and left HIPPA compliant voice message to contact office. Valarie Cones

## 2021-08-23 ENCOUNTER — Other Ambulatory Visit: Payer: Self-pay | Admitting: Pharmacist

## 2021-08-23 ENCOUNTER — Other Ambulatory Visit (HOSPITAL_COMMUNITY): Payer: Self-pay

## 2021-08-23 ENCOUNTER — Ambulatory Visit: Payer: BC Managed Care – PPO | Admitting: Infectious Diseases

## 2021-08-23 ENCOUNTER — Other Ambulatory Visit: Payer: Self-pay

## 2021-08-23 ENCOUNTER — Encounter: Payer: Self-pay | Admitting: Infectious Diseases

## 2021-08-23 VITALS — BP 124/81 | HR 86 | Temp 97.9°F | Wt 118.0 lb

## 2021-08-23 DIAGNOSIS — F32A Depression, unspecified: Secondary | ICD-10-CM

## 2021-08-23 DIAGNOSIS — R319 Hematuria, unspecified: Secondary | ICD-10-CM | POA: Diagnosis not present

## 2021-08-23 DIAGNOSIS — B20 Human immunodeficiency virus [HIV] disease: Secondary | ICD-10-CM | POA: Insufficient documentation

## 2021-08-23 DIAGNOSIS — A5149 Other secondary syphilitic conditions: Secondary | ICD-10-CM

## 2021-08-23 DIAGNOSIS — Z113 Encounter for screening for infections with a predominantly sexual mode of transmission: Secondary | ICD-10-CM | POA: Diagnosis not present

## 2021-08-23 LAB — URINALYSIS, ROUTINE W REFLEX MICROSCOPIC
Bacteria, UA: NONE SEEN /HPF
Bilirubin Urine: NEGATIVE
Glucose, UA: NEGATIVE
Hgb urine dipstick: NEGATIVE
Hyaline Cast: NONE SEEN /LPF
Ketones, ur: NEGATIVE
Leukocytes,Ua: NEGATIVE
Nitrite: NEGATIVE
RBC / HPF: NONE SEEN /HPF (ref 0–2)
Specific Gravity, Urine: 1.025 (ref 1.001–1.035)
Squamous Epithelial / HPF: NONE SEEN /HPF (ref <=5)
WBC, UA: NONE SEEN /HPF (ref 0–5)
pH: 5.5 (ref 5.0–8.0)

## 2021-08-23 LAB — MICROSCOPIC MESSAGE

## 2021-08-23 MED ORDER — SULFAMETHOXAZOLE-TRIMETHOPRIM 800-160 MG PO TABS
1.0000 | ORAL_TABLET | Freq: Every day | ORAL | 6 refills | Status: DC
Start: 2021-08-23 — End: 2022-10-30
  Filled 2021-08-23 – 2021-08-29 (×2): qty 30, 30d supply, fill #0
  Filled 2021-11-01: qty 30, 30d supply, fill #1

## 2021-08-23 MED ORDER — SYMTUZA 800-150-200-10 MG PO TABS: 1.0000 | ORAL_TABLET | Freq: Every day | ORAL | 0 refills | Status: AC

## 2021-08-23 MED ORDER — ENSURE PO LIQD: 237.0000 mL | Freq: Two times a day (BID) | ORAL | 11 refills | Status: AC

## 2021-08-23 MED ORDER — TIVICAY 50 MG PO TABS
50.0000 mg | ORAL_TABLET | Freq: Every day | ORAL | 5 refills | Status: DC
  Filled 2021-08-23 – 2021-08-29 (×2): qty 30, 30d supply, fill #0
  Filled 2021-10-05: qty 30, 30d supply, fill #1
  Filled 2021-11-01: qty 30, 30d supply, fill #2

## 2021-08-23 MED ORDER — SYMTUZA 800-150-200-10 MG PO TABS
1.0000 | ORAL_TABLET | Freq: Every day | ORAL | 5 refills | Status: DC
  Filled 2021-08-23 – 2021-08-29 (×2): qty 30, 30d supply, fill #0
  Filled 2021-10-05: qty 30, 30d supply, fill #1
  Filled 2021-11-01: qty 30, 30d supply, fill #2

## 2021-08-23 NOTE — Progress Notes (Signed)
Name: Derek Donovan  DOB: 1965-03-23 MRN: [id number] PCP: Patient, No Pcp Per     Subjective:   Chief Complaint  Patient presents with   Follow-up  Has had significant weight loss over the last 7 months. Intermittent back pain. Dark urine.    HPI: Derek Donovan is here for follow up - LOV in September 2022 and was having trouble taking his medications then. Depression is the biggest barrier to continuing his medications regularly. He brought his medication with him today and has a bottle of Prezista from 2018 and Tivicay from 9-22.   Working 3rd shift now. He reports that he had blood in the urine a while back but not sure if that cleared up. Had some pain in the back at one point but this has gone away   Reports no complaints today suggestive of associated opportunistic infection or advancing HIV disease such as fevers, night sweats, weight loss, anorexia, cough, SOB, nausea, vomiting, diarrhea, headache, sensory changes, lymphadenopathy or oral thrush.        08/23/2021    9:59 AM  Depression screen PHQ 2/9  Decreased Interest 0  Down, Depressed, Hopeless 0  PHQ - 2 Score 0     Review of Systems  Constitutional:  Positive for weight loss. Negative for chills, fever and malaise/fatigue.  HENT:  Negative for sore throat.        No dental problems  Respiratory:  Negative for cough and sputum production.   Cardiovascular:  Negative for chest pain and leg swelling.  Gastrointestinal:  Negative for abdominal pain, diarrhea and vomiting.  Genitourinary:  Negative for dysuria and flank pain.  Musculoskeletal:  Negative for joint pain, myalgias and neck pain.  Skin:  Negative for rash.  Neurological:  Negative for dizziness, tingling and headaches.  Psychiatric/Behavioral:  Positive for depression. Negative for substance abuse. The patient is not nervous/anxious and does not have insomnia.     Past Medical History:  Diagnosis Date   HIV (human immunodeficiency virus infection)  ([place])    HIV infection ([place])     Outpatient Medications Prior to Visit  Medication Sig Dispense Refill   Darunavir-Cobicistat-Emtricitabine-Tenofovir Alafenamide (SYMTUZA) 800-150-200-10 MG TABS Take 1 tablet by mouth daily with breakfast. 30 tablet 5   dolutegravir (TIVICAY) 50 MG tablet Take 1 tablet (50 mg total) by mouth daily. 30 tablet 5   azithromycin (ZITHROMAX) 600 MG tablet Take 2 tablets (1,200 mg total) by mouth once a week. (Patient not taking: Reported on 08/23/2021) 8 tablet 11   sulfamethoxazole-trimethoprim (BACTRIM DS) 800-160 MG tablet Take 1 tablet by mouth daily. (Patient not taking: Reported on 08/23/2021) 30 tablet 6   No facility-administered medications prior to visit.     Allergies  Allergen Reactions   Epinephrine Shortness Of Breath    Social History   Tobacco Use   Smoking status: Every Day    Packs/day: 0.50    Years: 10.00    Total pack years: 5.00    Types: Cigarettes   Smokeless tobacco: Never  Substance Use Topics   Alcohol use: Yes    Alcohol/week: 1.0 standard drink of alcohol    Types: 1 Cans of beer per week    Comment: occ   Drug use: Yes    Types: Marijuana    Comment: occassional     Family History  Problem Relation Age of Onset   Hypertension Mother     Social History   Substance and Sexual Activity  Sexual Activity Not Currently  Partners: Female, Male   Comment: given condoms     Objective:   Vitals:   08/23/21 0958  BP: 124/81  Pulse: 86  Temp: 97.9 F (36.6 C)  TempSrc: Temporal  Weight: 118 lb (53.5 kg)   Body mass index is 19.05 kg/m.  Physical Exam Constitutional:      Appearance: Normal appearance. He is not ill-appearing.  HENT:     Head: Normocephalic.     Mouth/Throat:     Mouth: Mucous membranes are moist.     Pharynx: Oropharynx is clear.  Eyes:     General: No scleral icterus. Cardiovascular:     Rate and Rhythm: Normal rate.  Pulmonary:     Effort: Pulmonary effort is normal.   Musculoskeletal:        General: Normal range of motion.     Cervical back: Normal range of motion.  Skin:    Coloration: Skin is not jaundiced or pale.  Neurological:     Mental Status: He is alert and oriented to person, place, and time.  Psychiatric:        Mood and Affect: Mood normal.        Judgment: Judgment normal.     Lab Results Lab Results  Component Value Date   WBC 4.5 11/09/2020   HGB 13.1 (L) 11/09/2020   HCT 39.8 11/09/2020   MCV 85.0 11/09/2020   PLT 173 11/09/2020    Lab Results  Component Value Date   CREATININE 1.05 11/09/2020   BUN 14 11/09/2020   NA 139 11/09/2020   K 3.8 11/09/2020   CL 106 11/09/2020   CO2 26 11/09/2020    Lab Results  Component Value Date   ALT 18 11/09/2020   AST 25 11/09/2020   ALKPHOS 72 05/01/2015   BILITOT 0.6 11/09/2020    Lab Results  Component Value Date   CHOL 143 10/25/2019   HDL 39 (L) 10/25/2019   LDLCALC 86 10/25/2019   TRIG 85 10/25/2019   CHOLHDL 3.7 10/25/2019   HIV 1 RNA Quant  Date Value  11/09/2020 150,000 copies/mL (H)  01/31/2020 89,300 Copies/mL (H)  11/29/2019 74 Copies/mL (H)   CD4 T Cell Abs (/uL)  Date Value  11/09/2020 <35 (L)  01/31/2020 <35 (L)  11/29/2019 <35 (L)     Assessment & Plan:   Problem List Items Addressed This Visit       Unprioritized   AIDS (acquired immune deficiency syndrome) ([place]) - Primary    He is open to taking bactrim daily for prophylaxis. Explained his weight loss is most certainly associated with advanced HIV / AIDS and should improve on treatment.       Relevant Medications   sulfamethoxazole-trimethoprim (BACTRIM DS) 800-160 MG tablet   Darunavir-Cobicistat-Emtricitabine-Tenofovir Alafenamide (SYMTUZA) 800-150-200-10 MG TABS   dolutegravir (TIVICAY) 50 MG tablet   Depression    Uncontrolled - [facility] connections today. He will follow up with me frequently as well for some more support. I think he would benefit from counseling/CBT but not sure he is  ready to jump in to that yet. He has some extra motivation today to help his mom worry less about him.       Hematuria    Reports a relatively recent history of hematuria but not gross based on his description of current problem. Has some urinary dribbling and mostly dark yellow urine.  Will check U/A and PSA today.  If hematuria persistent will arrange for CT scan and urology referral.  Relevant Orders   Urinalysis, Routine w reflex microscopic   PSA   Human immunodeficiency virus (HIV) disease ([place])    Uncontrolled due to difficult adherence.  He has been taking an incomplete regimen with unboosted darunavir and tivicay for 3 weeks. Has about a week of tivicay left in the bottle. I gave him a new bottle of symtuza to take with him and asked him to please refill his 3 pills at the [facility] (tivicay, symtuza, bactrim).   The only mutations I can see are a previous M184V. I think he largely has not been taking mediations at all so hopefully he has no other mutations that would prohibit cabenuva use. He would still like to take ARV pills if he is not a cabenuva candidate. We discussed transition from pills to injectables and he needs to be undetecatble first. I will run a genotype today as I presume he still has detectable virus and have him back in 1 month.   Connected to [place] today for resources - food insecurity, adherence support, depression and community support would be helpful for him. He is excited about working with them and met with Bri today.   Return in about 4 weeks (around 09/20/2021).       Relevant Medications   sulfamethoxazole-trimethoprim (BACTRIM DS) 800-160 MG tablet   Darunavir-Cobicistat-Emtricitabine-Tenofovir Alafenamide (SYMTUZA) 800-150-200-10 MG TABS   dolutegravir (TIVICAY) 50 MG tablet   Other Relevant Orders   RPR   HIV RNA, RTPCR W/R GT (RTI, PI,INT)   T-helper cells (CD4) count   COMPLETE METABOLIC PANEL WITH GFR   AMB REFERRAL TO  [facility]ITY [facility]ENC[facility]   Secondary syphilis    Skin lesions have resolved. Will follow up RPR after treatment.       Relevant Medications   sulfamethoxazole-trimethoprim (BACTRIM DS) 800-160 MG tablet   Darunavir-Cobicistat-Emtricitabine-Tenofovir Alafenamide (SYMTUZA) 800-150-200-10 MG TABS   dolutegravir (TIVICAY) 50 MG tablet   Janene Madeira, MSN, NP-C [facility] [place] Pager: [phone] Office: [phone]  08/23/21  11:07 AM

## 2021-08-23 NOTE — Assessment & Plan Note (Signed)
Skin lesions have resolved. Will follow up RPR after treatment.

## 2021-08-23 NOTE — Assessment & Plan Note (Signed)
Uncontrolled - [facility] connections today. He will follow up with me frequently as well for some more support. I think he would benefit from counseling/CBT but not sure he is ready to jump in to that yet. He has some extra motivation today to help his mom worry less about him.

## 2021-08-23 NOTE — Assessment & Plan Note (Signed)
Uncontrolled due to difficult adherence.  He has been taking an incomplete regimen with unboosted darunavir and tivicay for 3 weeks. Has about a week of tivicay left in the bottle. I gave him a new bottle of symtuza to take with him and asked him to please refill his 3 pills at the [facility] (tivicay, symtuza, bactrim).   The only mutations I can see are a previous M184V. I think he largely has not been taking mediations at all so hopefully he has no other mutations that would prohibit cabenuva use. He would still like to take ARV pills if he is not a cabenuva candidate. We discussed transition from pills to injectables and he needs to be undetecatble first. I will run a genotype today as I presume he still has detectable virus and have him back in 1 month.   Connected to [place] today for resources - food insecurity, adherence support, depression and community support would be helpful for him. He is excited about working with them and met with Bri today.   Return in about 4 weeks (around 09/20/2021).

## 2021-08-23 NOTE — Assessment & Plan Note (Signed)
He is open to taking bactrim daily for prophylaxis. Explained his weight loss is most certainly associated with advanced HIV / AIDS and should improve on treatment.

## 2021-08-23 NOTE — Progress Notes (Signed)
Medication Samples have been provided to the patient.  Drug name: Symtuza        Strength: 800/150/200/10 mg Qty: 30  Tablets (1 bottles) LOT: [id number]   Exp.Date: 4/25  Dosing instructions: Take one tablet by mouth once daily with food  The patient has been instructed regarding the correct time, dose, and frequency of taking this medication, including desired effects and most common side effects.   Gustave Lindeman, PharmD, CPP, BCIDP Clinical Pharmacist Practitioner Infectious Diseases Clinical Pharmacist [facility]  

## 2021-08-23 NOTE — Patient Instructions (Addendum)
Your medications you need everyday for now:   SYMTUZA (oval yellow pill). Take this once a day with food.  TIVICAY (small circle yellow pill). Take once a day WITH the Symtuza  BACTRIM (oval antibiotic pill, usually white). Take once a day with your other pills   Will see if [facility] can come talk with you today   Will see if we can help with some Ensure for you to help with the weight loss but I have a feeling over the next few months of keeping up with medications consistently you will naturally regain weight.   Take the Ensure between meals and separate it from your HIV medication by 6 hours please   Would love to see you back in 1 month so we can re-evaluate if [facility] Groom is an option for you.

## 2021-08-23 NOTE — Assessment & Plan Note (Signed)
Reports a relatively recent history of hematuria but not gross based on his description of current problem. Has some urinary dribbling and mostly dark yellow urine.  Will check U/A and PSA today.  If hematuria persistent will arrange for CT scan and urology referral.

## 2021-08-24 LAB — T-HELPER CELLS (CD4) COUNT (NOT AT ARMC)
CD4 % Helper T Cell: 2 % — ABNORMAL LOW (ref 33–65)
CD4 T Cell Abs: <35 /uL — ABNORMAL LOW (ref 400–1790)

## 2021-08-29 ENCOUNTER — Other Ambulatory Visit (HOSPITAL_COMMUNITY): Payer: Self-pay

## 2021-09-03 LAB — COMPLETE METABOLIC PANEL WITHOUT GFR
AG Ratio: 1 (calc) (ref 1.0–2.5)
ALT: 13 U/L (ref 9–46)
AST: 17 U/L (ref 10–35)
Albumin: 3.7 g/dL (ref 3.6–5.1)
Alkaline phosphatase (APISO): 55 U/L (ref 35–144)
BUN/Creatinine Ratio: 20 (calc) (ref 6–22)
BUN: 27 mg/dL — ABNORMAL HIGH (ref 7–25)
CO2: 28 mmol/L (ref 20–32)
Calcium: 8.9 mg/dL (ref 8.6–10.3)
Chloride: 107 mmol/L (ref 98–110)
Creat: 1.36 mg/dL — ABNORMAL HIGH (ref 0.70–1.30)
Globulin: 3.8 g/dL — ABNORMAL HIGH (ref 1.9–3.7)
Glucose, Bld: 64 mg/dL — ABNORMAL LOW (ref 65–99)
Potassium: 4.4 mmol/L (ref 3.5–5.3)
Sodium: 140 mmol/L (ref 135–146)
Total Bilirubin: 0.3 mg/dL (ref 0.2–1.2)
Total Protein: 7.5 g/dL (ref 6.1–8.1)
eGFR: 61 mL/min/1.73m2 (ref >=60)

## 2021-09-03 LAB — HIV-1 INTEGRASE GENOTYPE

## 2021-09-03 LAB — PSA: PSA: 2.06 ng/mL (ref <=4.00)

## 2021-09-03 LAB — HIV-1 GENOTYPE: HIV-1 Genotype: DETECTED — AB

## 2021-09-03 LAB — FLUORESCENT TREPONEMAL AB(FTA)-IGG-BLD: Fluorescent Treponemal ABS: REACTIVE — AB

## 2021-09-03 LAB — RPR TITER: RPR Titer: 1:32 {titer} — ABNORMAL HIGH

## 2021-09-03 LAB — HIV RNA, RTPCR W/R GT (RTI, PI,INT)
HIV 1 RNA Quant: 994 {copies}/mL — ABNORMAL HIGH
HIV-1 RNA Quant, Log: 3 {Log_copies}/mL — ABNORMAL HIGH

## 2021-09-03 LAB — SYPHILIS: RPR W/REFLEX TO RPR TITER AND TREPONEMAL ANTIBODIES, TRADITIONAL SCREENING AND DIAGNOSIS ALGORITHM: RPR Ser Ql: REACTIVE — AB

## 2021-09-04 ENCOUNTER — Other Ambulatory Visit (HOSPITAL_COMMUNITY): Payer: Self-pay

## 2021-09-04 ENCOUNTER — Telehealth: Payer: Self-pay

## 2021-09-04 NOTE — Telephone Encounter (Signed)
Spoke with Minerva Areola, relayed that his urine was normal and syphilis results indicate successful treatment.   We discussed that his viral load has decreased and he is very happy to hear this. Encouraged continued adherence to [place] and Tivicay once a day with food.   Advised that his kidney function was elevated and that this test will be repeated at his next visit. Recommended that he avoid things like ibuprofen and aleve in the meantime as well as stay hydrated.   Advised that he continue Bactrim to prevent infections like pneumonia, as his CD4 count is low. Patient verbalized understanding and has no further questions.   He is concerned about his medication copay as he does not get paid until this Thursday. Provided him with phone number for [facility], he will call to inquire about his copay.   Sandie Ano, RN

## 2021-09-04 NOTE — Progress Notes (Signed)
Please call Derek Donovan to let him know that his urine appearance is normal - no blood or anything concerning. His syphilis levels are down indicating a successful treatment from his last infection.  Continue the Symtuza (yellow oval pill) and Tivicay (smaller yellow circle pill) both together once a day with food. His viral load is down to 994 copies so I can tell he has been doing good trying to get back on his medications.  His kidney function has increased a little so I will make sure we repeat that for him next time - could be due to HIV damage but would ask he stay away from ibuprofen/aleve over the counter medications and drink plenty of water in the interim.  His CD4 count as expected is < 35 cells - he should continue the bactrim once a day as well for prevention of other conditions related to a low immune system.   I look forward to talking with him at our upcoming appointment together.

## 2021-09-04 NOTE — Telephone Encounter (Signed)
-----   Message from Blanchard Kelch, NP sent at 09/04/2021 10:03 AM EDT ----- Please call Derek Donovan to let him know that his urine appearance is normal - no blood or anything concerning. His syphilis levels are down indicating a successful treatment from his last infection.  Continue the Symtuza (yellow oval pill) and Tivicay (smaller yellow circle pill) both together once a day with food. His viral load is down to 994 copies so I can tell he has been doing good trying to get back on his medications.  His kidney function has increased a little so I will make sure we repeat that for him next time - could be due to HIV damage but would ask he stay away from ibuprofen/aleve over the counter medications and drink plenty of water in the interim.  His CD4 count as expected is < 35 cells - he should continue the bactrim once a day as well for prevention of other conditions related to a low immune system.   I look forward to talking with him at our upcoming appointment together.

## 2021-09-13 ENCOUNTER — Other Ambulatory Visit (HOSPITAL_COMMUNITY): Payer: Self-pay

## 2021-09-18 ENCOUNTER — Other Ambulatory Visit (HOSPITAL_COMMUNITY): Payer: Self-pay

## 2021-09-19 NOTE — Progress Notes (Unsigned)
Name: Derek Donovan  DOB: 1965-07-14 MRN: [id number] PCP: Patient, No Pcp Per     Subjective:   No chief complaint on file. Here for 4 week check in after resuming HIV medications.    HPI: Derek Donovan is here for follow up - LOV in September 2022 and was having trouble taking his medications then. Depression is the biggest barrier to continuing his medications regularly. He brought his medication with him today and has a bottle of Prezista from 2018 and Tivicay from 9-22.   Working 3rd shift now. He reports that he had blood in the urine a while back but not sure if that cleared up. Had some pain in the back at one point but this has gone away   Reports no complaints today suggestive of associated opportunistic infection or advancing HIV disease such as fevers, night sweats, weight loss, anorexia, cough, SOB, nausea, vomiting, diarrhea, headache, sensory changes, lymphadenopathy or oral thrush.        08/23/2021    9:59 AM  Depression screen PHQ 2/9  Decreased Interest 0  Down, Depressed, Hopeless 0  PHQ - 2 Score 0     Review of Systems  Constitutional:  Positive for weight loss. Negative for chills, fever and malaise/fatigue.  HENT:  Negative for sore throat.        No dental problems  Respiratory:  Negative for cough and sputum production.   Cardiovascular:  Negative for chest pain and leg swelling.  Gastrointestinal:  Negative for abdominal pain, diarrhea and vomiting.  Genitourinary:  Negative for dysuria and flank pain.  Musculoskeletal:  Negative for joint pain, myalgias and neck pain.  Skin:  Negative for rash.  Neurological:  Negative for dizziness, tingling and headaches.  Psychiatric/Behavioral:  Positive for depression. Negative for substance abuse. The patient is not nervous/anxious and does not have insomnia.     Past Medical History:  Diagnosis Date   HIV (human immunodeficiency virus infection) ([facility])    HIV infection ([facility])     Outpatient Medications  Prior to Visit  Medication Sig Dispense Refill   azithromycin (ZITHROMAX) 600 MG tablet Take 2 tablets (1,200 mg total) by mouth once a week. (Patient not taking: Reported on 08/23/2021) 8 tablet 11   Darunavir-Cobicistat-Emtricitabine-Tenofovir Alafenamide (SYMTUZA) 800-150-200-10 MG TABS Take 1 tablet by mouth daily with breakfast. 30 tablet 5   Darunavir-Cobicistat-Emtricitabine-Tenofovir Alafenamide (SYMTUZA) 800-150-200-10 MG TABS Take 1 tablet by mouth daily with breakfast. 30 tablet 0   dolutegravir (TIVICAY) 50 MG tablet Take 1 tablet (50 mg total) by mouth daily. 30 tablet 5   Ensure (ENSURE) Take 237 mLs by mouth 2 (two) times daily between meals. 237 mL 11   sulfamethoxazole-trimethoprim (BACTRIM DS) 800-160 MG tablet Take 1 tablet by mouth daily. 30 tablet 6   No facility-administered medications prior to visit.     Allergies  Allergen Reactions   Epinephrine Shortness Of Breath    Social History   Tobacco Use   Smoking status: Every Day    Packs/day: 0.50    Years: 10.00    Total pack years: 5.00    Types: Cigarettes   Smokeless tobacco: Never  Substance Use Topics   Alcohol use: Yes    Alcohol/week: 1.0 standard drink of alcohol    Types: 1 Cans of beer per week    Comment: occ   Drug use: Yes    Types: Marijuana    Comment: occassional     Family History  Problem Relation Age of Onset  Hypertension Mother     Social History   Substance and Sexual Activity  Sexual Activity Not Currently   Partners: Female, Male   Comment: given condoms     Objective:   There were no vitals filed for this visit.  There is no height or weight on file to calculate BMI.  Physical Exam Constitutional:      Appearance: Normal appearance. He is not ill-appearing.  HENT:     Head: Normocephalic.     Mouth/Throat:     Mouth: Mucous membranes are moist.     Pharynx: Oropharynx is clear.  Eyes:     General: No scleral icterus. Cardiovascular:     Rate and Rhythm:  Normal rate.  Pulmonary:     Effort: Pulmonary effort is normal.  Musculoskeletal:        General: Normal range of motion.     Cervical back: Normal range of motion.  Skin:    Coloration: Skin is not jaundiced or pale.  Neurological:     Mental Status: He is alert and oriented to person, place, and time.  Psychiatric:        Mood and Affect: Mood normal.        Judgment: Judgment normal.     Lab Results Lab Results  Component Value Date   WBC 4.5 11/09/2020   HGB 13.1 (L) 11/09/2020   HCT 39.8 11/09/2020   MCV 85.0 11/09/2020   PLT 173 11/09/2020    Lab Results  Component Value Date   CREATININE 1.36 (H) 08/23/2021   BUN 27 (H) 08/23/2021   NA 140 08/23/2021   K 4.4 08/23/2021   CL 107 08/23/2021   CO2 28 08/23/2021    Lab Results  Component Value Date   ALT 13 08/23/2021   AST 17 08/23/2021   ALKPHOS 72 05/01/2015   BILITOT 0.3 08/23/2021    Lab Results  Component Value Date   CHOL 143 10/25/2019   HDL 39 (L) 10/25/2019   LDLCALC 86 10/25/2019   TRIG 85 10/25/2019   CHOLHDL 3.7 10/25/2019   HIV 1 RNA Quant  Date Value  08/23/2021 994 copies/mL (H)  11/09/2020 150,000 copies/mL (H)  01/31/2020 89,300 Copies/mL (H)   CD4 T Cell Abs (/uL)  Date Value  08/23/2021 <35 (L)  11/09/2020 <35 (L)  01/31/2020 <35 (L)     Assessment & Plan:   Problem List Items Addressed This Visit   None Rexene Alberts, MSN, NP-C [facility] Pager: [phone] Office: [phone]  09/19/21  5:07 PM

## 2021-09-20 ENCOUNTER — Other Ambulatory Visit: Payer: Self-pay

## 2021-09-20 ENCOUNTER — Ambulatory Visit: Payer: BC Managed Care – PPO | Admitting: Infectious Diseases

## 2021-09-20 ENCOUNTER — Encounter: Payer: Self-pay | Admitting: Infectious Diseases

## 2021-09-20 VITALS — BP 119/77 | HR 94 | Temp 98.2°F | Resp 16 | Ht 66.0 in | Wt 124.0 lb

## 2021-09-20 DIAGNOSIS — B20 Human immunodeficiency virus [HIV] disease: Secondary | ICD-10-CM

## 2021-09-20 NOTE — Assessment & Plan Note (Signed)
Continue Bactrim for prophylaxis. Suspect he will be on this at least 6 months depending on CD4 recovery. No signs of OI today

## 2021-09-20 NOTE — Patient Instructions (Addendum)
So nice to see you today  Please stop by the lab on your way out.   Make sure the Ensure is used BETWEEN meals and at least 6 hours after you take your mediation.   I would love to see you back in 3 months to check on you again and get you your flu shot if you will take it.

## 2021-09-20 NOTE — Assessment & Plan Note (Signed)
Here for 68m check in on adherence with Symtuza and Tivicay taken once a day together. His VL was down to 994 copies recently on incomplete regimen of pills he had at home.   In looking back at genotypes I think we may be able to switch to a single tablet regimen once we get him suppressed. From what I can tell in records his regimen was augmented due to adherence concerns. He would be a good symtuza candidate going forward. Will continue current regimen for now to help with more rapid reduction of VL with integrase on board.   Counseled to avoid ensure within 6h of HIV medication.   RTC in 41m to see me again.

## 2021-09-24 LAB — HIV-1 RNA QUANT-NO REFLEX-BLD
HIV 1 RNA Quant: 27 Copies/mL — ABNORMAL HIGH
HIV-1 RNA Quant, Log: 1.43 Log cps/mL — ABNORMAL HIGH

## 2021-09-25 ENCOUNTER — Other Ambulatory Visit (HOSPITAL_COMMUNITY): Payer: Self-pay

## 2021-09-25 ENCOUNTER — Telehealth: Payer: Self-pay

## 2021-09-25 NOTE — Telephone Encounter (Signed)
Patient made aware of most recent lab results and congratulated. Advised to continue current plan and follow up in November.  Derek Donovan

## 2021-09-25 NOTE — Progress Notes (Signed)
Please call Derek Donovan and congratulate him for me - His viral load is undetectable! He has done a great job the last few months taking his medication.   No changes to the plan - please continue the Symtuza + Tivicay + Bactrim.

## 2021-09-25 NOTE — Telephone Encounter (Signed)
-----   Message from Blanchard Kelch, NP sent at 09/25/2021 10:44 AM EDT ----- Please call Derek Donovan and congratulate him for me - His viral load is undetectable! He has done a great job the last few months taking his medication.   No changes to the plan - please continue the Symtuza + Tivicay + Bactrim.

## 2021-10-05 ENCOUNTER — Other Ambulatory Visit (HOSPITAL_COMMUNITY): Payer: Self-pay

## 2021-10-08 ENCOUNTER — Other Ambulatory Visit (HOSPITAL_COMMUNITY): Payer: Self-pay

## 2021-11-01 ENCOUNTER — Other Ambulatory Visit (HOSPITAL_COMMUNITY): Payer: Self-pay

## 2021-11-05 ENCOUNTER — Other Ambulatory Visit (HOSPITAL_COMMUNITY): Payer: Self-pay

## 2021-11-08 ENCOUNTER — Telehealth: Payer: Self-pay

## 2021-11-08 NOTE — Telephone Encounter (Signed)
-----   Message from Mendon Callas, NP sent at 11/07/2021  1:55 PM EDT ----- Regarding: Can we do a call check in for [place]? Hi team -   Can we get a call to Jahquez to see how he is and if he has been able to continue getting his medication ([place])? His pharmacy sent a letter to [place] reporting he has not filled since July.  He is also without a follow up appt with me - since his VL was suppressed recently I don't want him to fall through the cracks being he won't be on the VL list.   Thank you greatly ~Stephanie

## 2021-11-08 NOTE — Telephone Encounter (Signed)
Attempted to call [place] to discuss medication, no answer. Left HIPAA compliant voicemail requesting callback.   Beryle Flock, RN

## 2021-11-09 NOTE — Telephone Encounter (Signed)
Patient stated he was doing well and he is taking his medication as prescribed. He is scheduled for follow up on 11/16 with you.   [place], CMA

## 2021-11-26 ENCOUNTER — Other Ambulatory Visit (HOSPITAL_COMMUNITY): Payer: Self-pay

## 2021-11-28 ENCOUNTER — Other Ambulatory Visit (HOSPITAL_COMMUNITY): Payer: Self-pay

## 2021-12-03 ENCOUNTER — Other Ambulatory Visit (HOSPITAL_COMMUNITY): Payer: Self-pay

## 2021-12-07 ENCOUNTER — Other Ambulatory Visit (HOSPITAL_COMMUNITY): Payer: Self-pay

## 2021-12-18 ENCOUNTER — Telehealth: Payer: Self-pay | Admitting: *Deleted

## 2021-12-18 NOTE — Progress Notes (Signed)
  Care Coordination  Outreach Note  12/18/2021 Name: Derek Donovan MRN: [id number] DOB: 1965/03/26   Care Coordination Outreach Attempts: An unsuccessful telephone outreach was attempted today to offer the patient information about available care coordination services as a benefit of their health plan.   Follow Up Plan:  Additional outreach attempts will be made to offer the patient care coordination information and services.   Encounter Outcome:  No Answer  [place]  Direct Dial: [phone]

## 2021-12-26 NOTE — Progress Notes (Signed)
  Care Coordination  Outreach Note  12/26/2021 Name: Derek Donovan MRN: [id number] DOB: 17-Mar-1965   Care Coordination Outreach Attempts: A second unsuccessful outreach was attempted today to offer the patient with information about available care coordination services as a benefit of their health plan.     Follow Up Plan:  Additional outreach attempts will be made to offer the patient care coordination information and services.   Encounter Outcome:  No Answer  [place]ie [place]  Care Coordination Care Guide  Direct Dial: [phone]

## 2021-12-27 ENCOUNTER — Ambulatory Visit: Payer: BC Managed Care – PPO | Admitting: Infectious Diseases

## 2022-01-07 NOTE — Progress Notes (Signed)
  Care Coordination  Outreach Note  01/07/2022 Name: Derek Donovan MRN: [id number] DOB: 12/29/1965   Care Coordination Outreach Attempts: A third unsuccessful outreach was attempted today to offer the patient with information about available care coordination services as a benefit of their health plan.   Follow Up Plan:  No further outreach attempts will be made at this time. We have been unable to contact the patient to offer or enroll patient in care coordination services  Encounter Outcome:  No Answer  Christie Nottingham  Care Coordination Care Guide  Direct Dial: [phone]

## 2022-02-22 ENCOUNTER — Other Ambulatory Visit (HOSPITAL_COMMUNITY): Payer: Self-pay

## 2022-05-18 DIAGNOSIS — K047 Periapical abscess without sinus: Secondary | ICD-10-CM | POA: Diagnosis not present

## 2022-05-18 DIAGNOSIS — R030 Elevated blood-pressure reading, without diagnosis of hypertension: Secondary | ICD-10-CM | POA: Diagnosis not present

## 2022-05-18 DIAGNOSIS — Z6820 Body mass index (BMI) 20.0-20.9, adult: Secondary | ICD-10-CM | POA: Diagnosis not present

## 2022-10-29 ENCOUNTER — Telehealth: Payer: Self-pay

## 2022-10-29 NOTE — Telephone Encounter (Signed)
Patient called today requesting appointment for tomorrow. States that he is having some eye irritation, congestion, and cold symptoms. Has not tested for covid.  Advised patient to go to Urgent care today for testing. Is scheduled tomorrow at 1. Juanita Laster, RMA

## 2022-10-30 ENCOUNTER — Ambulatory Visit (INDEPENDENT_AMBULATORY_CARE_PROVIDER_SITE_OTHER): Payer: BC Managed Care – PPO | Admitting: Infectious Diseases

## 2022-10-30 ENCOUNTER — Other Ambulatory Visit (HOSPITAL_COMMUNITY): Payer: Self-pay

## 2022-10-30 ENCOUNTER — Other Ambulatory Visit: Payer: Self-pay

## 2022-10-30 ENCOUNTER — Encounter: Payer: Self-pay | Admitting: Infectious Diseases

## 2022-10-30 VITALS — BP 111/77 | HR 83 | Temp 98.4°F | Ht 65.0 in | Wt 118.0 lb

## 2022-10-30 DIAGNOSIS — F17210 Nicotine dependence, cigarettes, uncomplicated: Secondary | ICD-10-CM | POA: Diagnosis not present

## 2022-10-30 DIAGNOSIS — B20 Human immunodeficiency virus [HIV] disease: Secondary | ICD-10-CM

## 2022-10-30 DIAGNOSIS — J411 Mucopurulent chronic bronchitis: Secondary | ICD-10-CM | POA: Diagnosis not present

## 2022-10-30 MED ORDER — SYMTUZA 800-150-200-10 MG PO TABS
1.0000 | ORAL_TABLET | Freq: Every day | ORAL | 11 refills | Status: DC
  Filled 2022-10-30 (×2): qty 30, 30d supply, fill #0
  Filled 2023-01-14: qty 30, 30d supply, fill #1
  Filled 2023-01-30 – 2023-03-06 (×3): qty 30, 30d supply, fill #2

## 2022-10-30 MED ORDER — AMOXICILLIN-POT CLAVULANATE 875-125 MG PO TABS
1.0000 | ORAL_TABLET | Freq: Two times a day (BID) | ORAL | 0 refills | Status: AC
Start: 2022-10-30 — End: ?
  Filled 2022-10-30: qty 10, 5d supply, fill #0

## 2022-10-30 MED ORDER — AZITHROMYCIN 250 MG PO TABS
ORAL_TABLET | ORAL | 0 refills | Status: AC
Start: 2022-10-30 — End: 2022-11-05
  Filled 2022-10-30: qty 6, 5d supply, fill #0

## 2022-10-30 MED ORDER — SULFAMETHOXAZOLE-TRIMETHOPRIM 800-160 MG PO TABS
1.0000 | ORAL_TABLET | Freq: Every day | ORAL | 6 refills | Status: DC
  Filled 2022-10-30: qty 30, 30d supply, fill #0
  Filled 2023-01-14: qty 30, 30d supply, fill #1
  Filled 2023-01-30 – 2023-03-06 (×3): qty 30, 30d supply, fill #2

## 2022-10-30 MED ORDER — TIVICAY 50 MG PO TABS
50.0000 mg | ORAL_TABLET | Freq: Every day | ORAL | 11 refills | Status: DC
  Filled 2022-10-30 (×2): qty 30, 30d supply, fill #0
  Filled 2023-01-14: qty 30, 30d supply, fill #1
  Filled 2023-01-30 – 2023-03-06 (×3): qty 30, 30d supply, fill #2

## 2022-10-30 NOTE — Assessment & Plan Note (Signed)
Will start with CAP tx azithromycin + augmentin x 5d and have him go for chest xray today.  Bactrim prophy to start Monday.  FU with me next week if not improved to reconsider given severely immunocompromised and risk for atypical infection high.

## 2022-10-30 NOTE — Assessment & Plan Note (Signed)
Non-hypoxic appearing today with last CD4 < 35 and largely off ARVs. Will start with CAP tx azithromycin + augmentin and have him go for chest xray today.

## 2022-10-30 NOTE — Assessment & Plan Note (Signed)
He wants to focus on pulmonary infection first then come back for labs in about 2 months.  Bactrim OI to start Monday 9.23 after he completes acute antibiotics for bronchitis/PNA.  Symtuza + Tivicay will also resume Monday 9.23. He sounds like he has been taking doses only intermittently. Will run a genotype next OV.

## 2022-10-30 NOTE — Progress Notes (Signed)
Name: Derek Donovan  DOB: 02-24-1965 MRN: [id number] PCP: Patient, No Pcp Per     Subjective:   Chief Complaint  Patient presents with   Follow-up    Cough, fever, sweating, some bodyache for past week    HPI: Crecencio is here for work in for acute URI symptoms.  He called yesterday requesting an appt for today to evaluate for eye irritation, congestion and cold symptoms. He only missed yesterday 9/17 from work. Symptoms for about 1 week now.  Felt like he has had fevers. Felt more warm - never cold but has had frequent episodes of sweating. He has had productive coughing recently   He has again fallen out of care - LOV was Aug 2023 and looks like he only filled his medication for 2 months in 2023 - last filled 10/09/2021. Prescribed regimen should be Symtuza + Tivicay --> VL undetectable @ 27 copies after restarting with persistent low CD4 < 35.    Wt Readings from Last 3 Encounters:  10/30/22 118 lb (53.5 kg)  09/20/21 124 lb (56.2 kg)  08/23/21 118 lb (53.5 kg)       10/30/2022    1:02 PM  Depression screen PHQ 2/9  Decreased Interest 0  Down, Depressed, Hopeless 1  PHQ - 2 Score 1     Review of Systems  Constitutional:  Positive for weight loss. Negative for chills, fever and malaise/fatigue.  HENT:  Negative for sinus pain and sore throat.        Runny nose really bad about a week back - this has since improved.   Eyes:  Negative for blurred vision, double vision, photophobia and pain. Eye discharge: gritty eye discomfort about a week ago - this has since improved.Marland Kitchen Respiratory:  Positive for cough, sputum production and wheezing.   Cardiovascular:  Negative for chest pain and leg swelling.  Gastrointestinal:  Negative for abdominal pain, diarrhea, nausea and vomiting.  Genitourinary:  Negative for dysuria and flank pain.  Musculoskeletal:  Negative for joint pain, myalgias and neck pain.  Skin:  Negative for rash.  Neurological:  Negative for dizziness, tingling  and headaches.  Psychiatric/Behavioral:  Positive for depression. Negative for substance abuse. The patient is not nervous/anxious and does not have insomnia.      Past Medical History:  Diagnosis Date   HIV (human immunodeficiency virus infection) (HCC)    HIV infection (HCC)     Outpatient Medications Prior to Visit  Medication Sig Dispense Refill   Darunavir-Cobicistat-Emtricitabine-Tenofovir Alafenamide (SYMTUZA) 800-150-200-10 MG TABS Take 1 tablet by mouth daily with breakfast. 30 tablet 5   dolutegravir (TIVICAY) 50 MG tablet Take 1 tablet (50 mg total) by mouth daily. 30 tablet 5   azithromycin (ZITHROMAX) 600 MG tablet Take 2 tablets (1,200 mg total) by mouth once a week. (Patient not taking: Reported on 08/23/2021) 8 tablet 11   Ensure (ENSURE) Take 237 mLs by mouth 2 (two) times daily between meals. (Patient not taking: Reported on 10/30/2022) 237 mL 11   sulfamethoxazole-trimethoprim (BACTRIM DS) 800-160 MG tablet Take 1 tablet by mouth daily. (Patient not taking: Reported on 10/30/2022) 30 tablet 6   No facility-administered medications prior to visit.     Allergies  Allergen Reactions   Epinephrine Shortness Of Breath    Social History   Tobacco Use   Smoking status: Every Day    Current packs/day: 0.50    Average packs/day: 0.5 packs/day for 10.0 years (5.0 ttl pk-yrs)    Types: Cigarettes  Smokeless tobacco: Never  Substance Use Topics   Alcohol use: Yes    Alcohol/week: 1.0 standard drink of alcohol    Types: 1 Cans of beer per week    Comment: occ   Drug use: Yes    Types: Marijuana    Comment: occassional     Family History  Problem Relation Age of Onset   Hypertension Mother     Social History   Substance and Sexual Activity  Sexual Activity Not Currently   Partners: Female, Male   Comment: given condoms     Objective:   Vitals:   10/30/22 1301  BP: 111/77  Pulse: 83  Temp: 98.4 F (36.9 C)  TempSrc: Oral  SpO2: 96%  Weight: 118  lb (53.5 kg)  Height: 5\' 5"  (1.651 m)    Body mass index is 19.64 kg/m.  Physical Exam Constitutional:      Appearance: Normal appearance. He is not ill-appearing.  HENT:     Head: Normocephalic.     Mouth/Throat:     Mouth: Mucous membranes are moist.     Pharynx: Oropharynx is clear.  Eyes:     General: No scleral icterus. Pulmonary:     Effort: Pulmonary effort is normal. No respiratory distress.     Breath sounds: Rhonchi and rales present. No wheezing.  Musculoskeletal:        General: Normal range of motion.     Cervical back: Normal range of motion.  Skin:    Coloration: Skin is not jaundiced or pale.  Neurological:     Mental Status: He is alert and oriented to person, place, and time.  Psychiatric:        Mood and Affect: Mood normal.        Judgment: Judgment normal.     Lab Results Lab Results  Component Value Date   WBC 4.5 11/09/2020   HGB 13.1 (L) 11/09/2020   HCT 39.8 11/09/2020   MCV 85.0 11/09/2020   PLT 173 11/09/2020    Lab Results  Component Value Date   CREATININE 1.36 (H) 08/23/2021   BUN 27 (H) 08/23/2021   NA 140 08/23/2021   K 4.4 08/23/2021   CL 107 08/23/2021   CO2 28 08/23/2021    Lab Results  Component Value Date   ALT 13 08/23/2021   AST 17 08/23/2021   ALKPHOS 72 05/01/2015   BILITOT 0.3 08/23/2021    Lab Results  Component Value Date   CHOL 143 10/25/2019   HDL 39 (L) 10/25/2019   LDLCALC 86 10/25/2019   TRIG 85 10/25/2019   CHOLHDL 3.7 10/25/2019   HIV 1 RNA Quant  Date Value  09/20/2021 27 Copies/mL (H)  08/23/2021 994 copies/mL (H)  11/09/2020 150,000 copies/mL (H)   CD4 T Cell Abs (/uL)  Date Value  08/23/2021 <35 (L)  11/09/2020 <35 (L)  01/31/2020 <35 (L)     Assessment & Plan:   Problem List Items Addressed This Visit       Unprioritized   Mucopurulent chronic bronchitis (HCC) - Primary    Will start with CAP tx azithromycin + augmentin x 5d and have him go for chest xray today.  Bactrim  prophy to start Monday.  FU with me next week if not improved to reconsider given severely immunocompromised and risk for atypical infection high.       Relevant Medications   Darunavir-Cobicistat-Emtricitabine-Tenofovir Alafenamide (SYMTUZA) 800-150-200-10 MG TABS   dolutegravir (TIVICAY) 50 MG tablet   azithromycin (ZITHROMAX Z-PAK)  250 MG tablet   amoxicillin-clavulanate (AUGMENTIN) 875-125 MG tablet   sulfamethoxazole-trimethoprim (BACTRIM DS) 800-160 MG tablet   Human immunodeficiency virus (HIV) disease (HCC)    He wants to focus on pulmonary infection first then come back for labs in about 2 months.  Bactrim OI to start Monday 9.23 after he completes acute antibiotics for bronchitis/PNA.  Symtuza + Tivicay will also resume Monday 9.23. He sounds like he has been taking doses only intermittently. Will run a genotype next OV.       Relevant Medications   Darunavir-Cobicistat-Emtricitabine-Tenofovir Alafenamide (SYMTUZA) 800-150-200-10 MG TABS   dolutegravir (TIVICAY) 50 MG tablet   azithromycin (ZITHROMAX Z-PAK) 250 MG tablet   sulfamethoxazole-trimethoprim (BACTRIM DS) 800-160 MG tablet   AIDS (acquired immune deficiency syndrome) (HCC)    Non-hypoxic appearing today with last CD4 < 35 and largely off ARVs. Will start with CAP tx azithromycin + augmentin and have him go for chest xray today.       Relevant Medications   Darunavir-Cobicistat-Emtricitabine-Tenofovir Alafenamide (SYMTUZA) 800-150-200-10 MG TABS   dolutegravir (TIVICAY) 50 MG tablet   azithromycin (ZITHROMAX Z-PAK) 250 MG tablet   sulfamethoxazole-trimethoprim (BACTRIM DS) 800-160 MG tablet    Rexene Alberts, MSN, NP-C [facility] Pager: [phone] Office: [phone]  10/30/22  4:46 PM

## 2022-10-30 NOTE — Patient Instructions (Addendum)
For the respiratory infection -   -Out for another 2 days (today and tomorrow) from work -CONTINUE the [employer] for the cough  -Honey can help as well  -START the Azithromycin (antibiotic #1) - take 2 tablets on day 1 today THEN take 1 tablet to finish the pills out -START the Augmentin (antibiotic #2) - take 1 tablet twice a day until gone.   Get a chest xray today at [facility] - [address]   On Monday Sept 23rd - RESUME the [place] and Tivicay and Bactrim EVERYDAY together with food after your antibiotics are done    Come see me next week if not feeling better   Otherwise would love to see you back in 2 months

## 2022-10-31 ENCOUNTER — Other Ambulatory Visit (HOSPITAL_COMMUNITY): Payer: Self-pay

## 2022-11-07 ENCOUNTER — Other Ambulatory Visit (HOSPITAL_COMMUNITY): Payer: Self-pay

## 2022-11-19 ENCOUNTER — Other Ambulatory Visit (HOSPITAL_COMMUNITY): Payer: Self-pay

## 2022-11-22 ENCOUNTER — Other Ambulatory Visit (HOSPITAL_COMMUNITY): Payer: Self-pay

## 2022-11-25 ENCOUNTER — Other Ambulatory Visit: Payer: Self-pay

## 2023-01-02 ENCOUNTER — Ambulatory Visit: Payer: BC Managed Care – PPO | Admitting: Infectious Diseases

## 2023-01-02 ENCOUNTER — Telehealth: Payer: Self-pay

## 2023-01-02 NOTE — Telephone Encounter (Signed)
Called Derek Donovan to see if he would be able to make it to today's appointment, no answer. Left HIPAA compliant voicemail requesting callback.   Sandie Ano, RN

## 2023-01-06 ENCOUNTER — Other Ambulatory Visit (HOSPITAL_COMMUNITY): Payer: Self-pay

## 2023-01-07 ENCOUNTER — Other Ambulatory Visit (HOSPITAL_COMMUNITY): Payer: Self-pay

## 2023-01-08 ENCOUNTER — Other Ambulatory Visit: Payer: Self-pay

## 2023-01-14 ENCOUNTER — Other Ambulatory Visit (HOSPITAL_COMMUNITY): Payer: Self-pay

## 2023-01-14 NOTE — Progress Notes (Signed)
Specialty Pharmacy Ongoing Clinical Assessment Note  Derek Donovan is a 57 y.o. male who is being followed by the specialty pharmacy service for RxSp HIV   Patient's specialty medication(s) reviewed today: Darun-Cobic-Emtricit-Tenofaf; Dolutegravir Sodium   Missed doses in the last 4 weeks: 0 (patient reports missing no doses, though his fill history indicates that he likely has)   Patient/Caregiver did not have any additional questions or concerns.   Therapeutic benefit summary: Patient is achieving benefit   Adverse events/side effects summary: No adverse events/side effects   Patient's therapy is appropriate to: Continue    Goals Addressed             This Visit's Progress    Achieve Undetectable HIV Viral Load < 20       Patient is on track. Patient will maintain adherence.  Last viral load was down to 27 copies/mL on 09/20/21.         Follow up:  6 months  Servando Snare Specialty Pharmacist

## 2023-01-14 NOTE — Progress Notes (Signed)
Specialty Pharmacy Refill Coordination Note  Derek Donovan is a 57 y.o. male contacted today regarding refills of specialty medication(s) Darun-Cobic-Emtricit-Tenofaf; Dolutegravir Sodium   Patient requested Derek Donovan at [facility] [place] date: 01/14/23   Medication will be filled on 01/14/23.

## 2023-01-15 ENCOUNTER — Other Ambulatory Visit (HOSPITAL_COMMUNITY): Payer: Self-pay

## 2023-01-30 ENCOUNTER — Other Ambulatory Visit: Payer: Self-pay

## 2023-01-30 NOTE — Progress Notes (Signed)
Specialty Pharmacy Refill Coordination Note  Derek Donovan is a 57 y.o. male contacted today regarding refills of specialty medication(s) Darun-Cobic-Emtricit-TenofAF (Symtuza); Dolutegravir Sodium (Tivicay)   Patient requested Pickup at [facility] date: 02/11/23   Medication will be filled on 02/10/23.

## 2023-02-10 ENCOUNTER — Other Ambulatory Visit: Payer: Self-pay

## 2023-02-21 ENCOUNTER — Other Ambulatory Visit (HOSPITAL_COMMUNITY): Payer: Self-pay

## 2023-02-26 ENCOUNTER — Other Ambulatory Visit: Payer: Self-pay

## 2023-02-27 ENCOUNTER — Other Ambulatory Visit (HOSPITAL_COMMUNITY): Payer: Self-pay

## 2023-02-28 ENCOUNTER — Other Ambulatory Visit: Payer: Self-pay

## 2023-02-28 ENCOUNTER — Other Ambulatory Visit (HOSPITAL_COMMUNITY): Payer: Self-pay

## 2023-02-28 NOTE — Progress Notes (Signed)
Medication's returned to stock - never picked up

## 2023-03-06 ENCOUNTER — Other Ambulatory Visit: Payer: Self-pay

## 2023-03-06 NOTE — Progress Notes (Signed)
Specialty Pharmacy Refill Coordination Note  Derek Donovan is a 58 y.o. male contacted today regarding refills of specialty medication(s) Darun-Cobic-Emtricit-TenofAF (Symtuza); Dolutegravir Sodium (Tivicay)   Patient requested Delivery   Pickup date: 03/07/23   Medication will be filled on 03/06/23.

## 2023-03-17 ENCOUNTER — Other Ambulatory Visit: Payer: Self-pay

## 2023-03-17 ENCOUNTER — Other Ambulatory Visit (HOSPITAL_COMMUNITY): Payer: Self-pay

## 2023-03-17 NOTE — Progress Notes (Signed)
03/17/23-CADema [place], (& Bactrim)  RTS on 02/03. Unable to reach patient. Ready since 01/24.

## 2023-03-21 ENCOUNTER — Other Ambulatory Visit (HOSPITAL_COMMUNITY): Payer: Self-pay

## 2023-03-25 ENCOUNTER — Other Ambulatory Visit (HOSPITAL_COMMUNITY): Payer: Self-pay

## 2023-03-31 ENCOUNTER — Other Ambulatory Visit: Payer: Self-pay

## 2023-05-15 ENCOUNTER — Ambulatory Visit: Admitting: Infectious Diseases

## 2023-05-19 ENCOUNTER — Encounter: Payer: Self-pay | Admitting: Internal Medicine

## 2023-05-19 ENCOUNTER — Ambulatory Visit: Admitting: Internal Medicine

## 2023-05-19 ENCOUNTER — Other Ambulatory Visit (HOSPITAL_COMMUNITY)
Admission: RE | Admit: 2023-05-19 | Discharge: 2023-05-19 | Disposition: A | Discharge status: Home / Self Care | Source: Ambulatory Visit | Attending: Internal Medicine | Admitting: Internal Medicine

## 2023-05-19 ENCOUNTER — Other Ambulatory Visit (HOSPITAL_COMMUNITY): Payer: Self-pay

## 2023-05-19 ENCOUNTER — Other Ambulatory Visit: Payer: Self-pay

## 2023-05-19 VITALS — BP 131/91 | HR 92 | Temp 98.1°F | Resp 16 | Wt 118.2 lb

## 2023-05-19 DIAGNOSIS — Z113 Encounter for screening for infections with a predominantly sexual mode of transmission: Secondary | ICD-10-CM

## 2023-05-19 DIAGNOSIS — B20 Human immunodeficiency virus [HIV] disease: Secondary | ICD-10-CM | POA: Diagnosis not present

## 2023-05-19 DIAGNOSIS — A539 Syphilis, unspecified: Secondary | ICD-10-CM

## 2023-05-19 DIAGNOSIS — F32A Depression, unspecified: Secondary | ICD-10-CM | POA: Diagnosis not present

## 2023-05-19 DIAGNOSIS — Z79899 Other long term (current) drug therapy: Secondary | ICD-10-CM | POA: Diagnosis not present

## 2023-05-19 MED ORDER — SYMTUZA 800-150-200-10 MG PO TABS
1.0000 | ORAL_TABLET | Freq: Every day | ORAL | 11 refills | Status: AC
  Filled 2023-05-19: qty 30, 30d supply, fill #0

## 2023-05-19 MED ORDER — CEFTRIAXONE SODIUM 500 MG IJ SOLR
500.0000 mg | Freq: Once | INTRAMUSCULAR | Status: AC
  Administered 2023-05-19: 500 mg via INTRAMUSCULAR

## 2023-05-19 MED ORDER — AZITHROMYCIN 250 MG PO TABS
1000.0000 mg | ORAL_TABLET | Freq: Once | ORAL | Status: AC
  Administered 2023-05-19: 1000 mg via ORAL

## 2023-05-19 MED ORDER — SULFAMETHOXAZOLE-TRIMETHOPRIM 800-160 MG PO TABS
1.0000 | ORAL_TABLET | Freq: Every day | ORAL | 6 refills | Status: AC
  Filled 2023-05-19: qty 30, 30d supply, fill #0

## 2023-05-19 MED ORDER — FLUOXETINE HCL 10 MG PO CAPS
10.0000 mg | ORAL_CAPSULE | Freq: Every day | ORAL | 3 refills | Status: AC
  Filled 2023-05-19: qty 30, 30d supply, fill #0

## 2023-05-19 MED ORDER — TIVICAY 50 MG PO TABS
50.0000 mg | ORAL_TABLET | Freq: Every day | ORAL | 11 refills | Status: AC
  Filled 2023-05-19: qty 30, 30d supply, fill #0

## 2023-05-19 MED ORDER — AZITHROMYCIN 600 MG PO TABS
1200.0000 mg | ORAL_TABLET | ORAL | 11 refills | Status: AC
  Filled 2023-05-19: qty 8, 28d supply, fill #0

## 2023-05-19 MED ORDER — PENICILLIN G BENZATHINE 1200000 UNIT/2ML IM SUSY
2.4000 10*6.[IU] | PREFILLED_SYRINGE | Freq: Once | INTRAMUSCULAR | Status: AC
  Administered 2023-05-19: 2.4 10*6.[IU] via INTRAMUSCULAR

## 2023-05-19 NOTE — Progress Notes (Signed)
 Patient ID: Derek Donovan, male   DOB: 08/25/1965, 58 y.o.   MRN: [id number]  HPI Derek Donovan 58 yo M with HIV disease, tivicay -symtuza . Has been off of meds-- has not been seen in over year.   Had recent break up   Has palmar rash. Also blister initial to groin. He has hx of unprotected sex with his partner that he is no longer seeing  Outpatient Encounter Medications as of 05/19/2023  Medication Sig   amoxicillin -clavulanate (AUGMENTIN ) 875-125 MG tablet Take 1 tablet by mouth 2 (two) times daily. (Patient not taking: Reported on 05/19/2023)   azithromycin  (ZITHROMAX ) 600 MG tablet Take 2 tablets (1,200 mg total) by mouth once a week. (Patient not taking: Reported on 05/19/2023)   Darunavir -Cobicistat-Emtricitabine -Tenofovir  Alafenamide (SYMTUZA ) 800-150-200-10 MG TABS Take 1 tablet by mouth daily with breakfast. (Patient not taking: Reported on 05/19/2023)   dolutegravir  (TIVICAY ) 50 MG tablet Take 1 tablet (50 mg total) by mouth daily. (Patient not taking: Reported on 05/19/2023)   Ensure (ENSURE) Take 237 mLs by mouth 2 (two) times daily between meals. (Patient not taking: Reported on 05/19/2023)   sulfamethoxazole -trimethoprim  (BACTRIM  DS) 800-160 MG tablet Take 1 tablet by mouth daily. (Patient not taking: Reported on 05/19/2023)   No facility-administered encounter medications on file as of 05/19/2023.     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   Human immunodeficiency virus (HIV) disease (HCC) 04/23/2010   TOBACCO USE 04/23/2010     Health Maintenance Due  Topic Date Due   DTaP/Tdap/Td (1 - Tdap) Never done   Zoster Vaccines- Shingrix (1 of 2) Never done   Colonoscopy  Never done   Pneumococcal Vaccine 64-7 Years old (2 of 2 - PCV) 10/02/2012   COVID-19  Vaccine (5 - 2024-25 season) 10/13/2022     Review of Systems 12 point ros is negative except what is mentioned above Physical Exam   BP (!) 131/91   Pulse 92   Temp 98.1 F (36.7 C) (Oral)   Resp 16   Wt 118 lb 3.2 oz (53.6 kg)   SpO2 99%   BMI 19.67 kg/m   Physical Exam  Constitutional: He is oriented to person, place, and time. He appears well-developed and well-nourished. No distress.  HENT:  Mouth/Throat: Oropharynx is clear and moist. No oropharyngeal exudate.  Cardiovascular: Normal rate, regular rhythm and normal heart sounds. Exam reveals no gallop and no friction rub.  No murmur heard.  Pulmonary/Chest: Effort normal and breath sounds normal. No respiratory distress. He has no wheezes.  Abdominal: Soft. Bowel sounds are normal. He exhibits no distension. There is no tenderness.  Lymphadenopathy:  He has no cervical adenopathy.  Neurological: He is alert and oriented to person, place, and time.  Skin: Skin is warm and dry. No rash noted. No erythema.  Psychiatric: He has a normal mood and affect. His behavior is normal.   Lab Results  Component Value Date   CD4TCELL 2 (L) 08/23/2021   Lab Results  Component Value Date   CD4TABS <35 (L) 08/23/2021   CD4TABS <35 (L) 11/09/2020   CD4TABS <35 (L) 01/31/2020   Lab Results  Component Value Date   HIV1RNAQUANT 27 (H) 09/20/2021   Lab Results  Component Value Date   HEPBSAB POS (A) 10/03/2011   Lab  Results  Component Value Date   LABRPR REACTIVE (A) 08/23/2021    CBC Lab Results  Component Value Date   WBC 4.5 11/09/2020   RBC 4.68 11/09/2020   HGB 13.1 (L) 11/09/2020   HCT 39.8 11/09/2020   PLT 173 11/09/2020   MCV 85.0 11/09/2020   MCH 28.0 11/09/2020   MCHC 32.9 11/09/2020   RDW 13.1 11/09/2020   LYMPHSABS 558 (L) 11/09/2020   MONOABS 0.6 05/01/2015   EOSABS 468 11/09/2020    BMET Lab Results  Component Value Date   NA 140 08/23/2021   K 4.4 08/23/2021   CL 107 08/23/2021   CO2 28  08/23/2021   GLUCOSE 64 (L) 08/23/2021   BUN 27 (H) 08/23/2021   CREATININE 1.36 (H) 08/23/2021   CALCIUM 8.9 08/23/2021   GFRNONAA 64 10/25/2019   GFRAA 74 10/25/2019      Assessment and Plan   Will empirically treat for syphilis, and treat for gc/chlam and test. And do anal pap  Depression = will start back on fluoxetine , and refer to counselor  Hiv disease= restart tivicay  and symtuza  and get genotype  Oi proph = bactrim , and azithro weekly

## 2023-05-20 LAB — CYTOLOGY, (ORAL, ANAL, URETHRAL) ANCILLARY ONLY
Chlamydia: NEGATIVE
Chlamydia: NEGATIVE
Comment: NEGATIVE
Comment: NEGATIVE
Comment: NORMAL
Comment: NORMAL
Neisseria Gonorrhea: NEGATIVE
Neisseria Gonorrhea: NEGATIVE

## 2023-05-20 LAB — T-HELPER CELL (CD4) - (RCID CLINIC ONLY)
CD4 % Helper T Cell: 4 % — ABNORMAL LOW (ref 33–65)
CD4 T Cell Abs: <35 /uL — ABNORMAL LOW (ref 400–1790)

## 2023-05-21 LAB — CYTOLOGY - PAP: Adequacy: ABSENT

## 2023-05-30 LAB — COMPLETE METABOLIC PANEL WITHOUT GFR
AG Ratio: 0.9 (calc) — ABNORMAL LOW (ref 1.0–2.5)
ALT: 7 U/L — ABNORMAL LOW (ref 9–46)
AST: 14 U/L (ref 10–35)
Albumin: 4.1 g/dL (ref 3.6–5.1)
Alkaline phosphatase (APISO): 98 U/L (ref 35–144)
BUN: 19 mg/dL (ref 7–25)
CO2: 27 mmol/L (ref 20–32)
Calcium: 9.1 mg/dL (ref 8.6–10.3)
Chloride: 105 mmol/L (ref 98–110)
Creat: 1.21 mg/dL (ref 0.70–1.30)
Globulin: 4.7 g/dL — ABNORMAL HIGH (ref 1.9–3.7)
Glucose, Bld: 121 mg/dL — ABNORMAL HIGH (ref 65–99)
Potassium: 3.5 mmol/L (ref 3.5–5.3)
Sodium: 138 mmol/L (ref 135–146)
Total Bilirubin: 0.3 mg/dL (ref 0.2–1.2)
Total Protein: 8.8 g/dL — ABNORMAL HIGH (ref 6.1–8.1)

## 2023-05-30 LAB — T PALLIDUM AB: T Pallidum Abs: POSITIVE — AB

## 2023-05-30 LAB — CBC WITH DIFFERENTIAL/PLATELET
Absolute Lymphocytes: 846 {cells}/uL — ABNORMAL LOW (ref 850–3900)
Absolute Monocytes: 451 {cells}/uL (ref 200–950)
Basophils Absolute: 41 {cells}/uL (ref 0–200)
Basophils Relative: 0.9 %
Eosinophils Absolute: 221 {cells}/uL (ref 15–500)
Eosinophils Relative: 4.8 %
HCT: 46.1 % (ref 38.5–50.0)
Hemoglobin: 15.3 g/dL (ref 13.2–17.1)
MCH: 28.4 pg (ref 27.0–33.0)
MCHC: 33.2 g/dL (ref 32.0–36.0)
MCV: 85.7 fL (ref 80.0–100.0)
MPV: 11.4 fL (ref 7.5–12.5)
Monocytes Relative: 9.8 %
Neutro Abs: 3041 {cells}/uL (ref 1500–7800)
Neutrophils Relative %: 66.1 %
Platelets: 132 10*3/uL — ABNORMAL LOW (ref 140–400)
RBC: 5.38 10*6/uL (ref 4.20–5.80)
RDW: 12.5 % (ref 11.0–15.0)
Total Lymphocyte: 18.4 %
WBC: 4.6 10*3/uL (ref 3.8–10.8)

## 2023-05-30 LAB — RPR: RPR Ser Ql: REACTIVE — AB

## 2023-05-30 LAB — HIV-1 INTEGRASE GENOTYPE

## 2023-05-30 LAB — LIPID PANEL
Cholesterol: 146 mg/dL (ref <200)
HDL: 32 mg/dL — ABNORMAL LOW (ref >=40)
LDL Cholesterol (Calc): 97 mg/dL
Non-HDL Cholesterol (Calc): 114 mg/dL (ref <130)
Total CHOL/HDL Ratio: 4.6 (calc) (ref <5.0)
Triglycerides: 81 mg/dL (ref <150)

## 2023-05-30 LAB — RPR TITER: RPR Titer: 1:4096 {titer} — ABNORMAL HIGH

## 2023-05-30 LAB — HIV-1 GENOTYPE: HIV-1 Genotype: DETECTED — AB

## 2023-05-30 LAB — HIV RNA, RTPCR W/R GT (RTI, PI,INT)
HIV 1 RNA Quant: 164000 {copies}/mL — ABNORMAL HIGH
HIV-1 RNA Quant, Log: 5.21 {Log_copies}/mL — ABNORMAL HIGH

## 2023-06-24 NOTE — Progress Notes (Signed)
 The 10-year ASCVD risk score (Arnett DK, et al., 2019) is: 13.8%   Values used to calculate the score:     Age: 58 years     Sex: Male     Is Non-Hispanic African American: Yes     Diabetic: No     Tobacco smoker: Yes     Systolic Blood Pressure: 131 mmHg     Is BP treated: No     HDL Cholesterol: 32 mg/dL     Total Cholesterol: 146 mg/dL  Arlon Bergamo, BSN, RN

## 2023-07-31 ENCOUNTER — Ambulatory Visit: Admitting: Pharmacist

## 2023-07-31 NOTE — Progress Notes (Deleted)
 HPI: Derek Donovan is a 58 y.o. male who presents to the [facility] pharmacy clinic for HIV follow-up.  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   AZITHROMYCIN  (ZITHROMAX ) 600 MG TABLET    Take 2 tablets (1,200 mg total) by mouth once a week. To prevent infection   DARUNAVIR -COBICISTAT-EMTRICITABINE -TENOFOVIR  ALAFENAMIDE (SYMTUZA ) 800-150-200-10 MG TABS    Take 1 tablet by mouth daily with breakfast.   DOLUTEGRAVIR  (TIVICAY ) 50 MG TABLET    Take 1 tablet (50 mg total) by mouth daily.   ENSURE (ENSURE)    Take 237 mLs by mouth 2 (two) times daily between meals.   FLUOXETINE  (PROZAC ) 10 MG CAPSULE    Take 1 capsule (10 mg total) by mouth daily.   SULFAMETHOXAZOLE -TRIMETHOPRIM  (BACTRIM  DS) 800-160 MG TABLET    Take 1 tablet by mouth daily.  Modified Medications   No medications on file  Discontinued Medications   No medications on file    Labs: Lab Results  Component Value Date   HIV1RNAQUANT 164,000 (H) 05/19/2023   HIV1RNAQUANT 27 (H) 09/20/2021   HIV1RNAQUANT 994 (H) 08/23/2021   CD4TABS <35 (L) 05/19/2023   CD4TABS <35 (L) 08/23/2021   CD4TABS <35 (L) 11/09/2020    RPR and STI Lab Results  Component Value Date   LABRPR REACTIVE (A) 05/19/2023   LABRPR REACTIVE (A) 08/23/2021   LABRPR REACTIVE (A) 11/09/2020   LABRPR REACTIVE (A) 11/29/2019   LABRPR REACTIVE (A) 10/25/2019   RPRTITER 1:4,096 (H) 05/19/2023   RPRTITER 1:32 (H) 08/23/2021   RPRTITER 1:256 (H) 11/09/2020    RPRTITER 1:16 (H) 11/29/2019   RPRTITER 1:16 (H) 10/25/2019    STI Results GC CT  05/19/2023 10:02 AM Negative    Negative  Negative    Negative   10/25/2019  9:45 AM Negative  Negative     Hepatitis B Lab Results  Component Value Date   HEPBSAB POS (A) 10/03/2011   HEPBSAG NEGATIVE 10/03/2011   HEPBCAB POS (A) 10/03/2011   Hepatitis C No results found for: HEPCAB, HCVRNAPCRQN Hepatitis A Lab Results  Component Value Date   HAV POS (A) 10/03/2011   Lipids: Lab Results  Component Value Date   CHOL 146 05/19/2023   TRIG 81 05/19/2023   HDL 32 (L) 05/19/2023   CHOLHDL 4.6 05/19/2023   VLDL 30 02/16/2014   LDLCALC 97 05/19/2023    Current HIV Regimen: Tivicay  and Symtuza   Assessment: Derek Donovan is a 58 year-old male here for HIV follow-up. He was last seen in office by Dr. Levern Reader on 05/19/2023. At that visit he reported being off antiretroviral therapy for some time. He had complaints of a palmar rash and a blister on his groin. He reported having unprotected sex with his partner at the time. He was treated empirically for syphilis with Bicillin . He was recommended to restart antiretroviral regimen and regimen for opportunistic infection prophylaxis (Bactrim  and Azithromycin  weekly). CBC, CMP, HIV with resistance, CD4 count, RPR with titer, lipid panel, and cytologies were collected. CMP,  CBC, and lipid panel from that time were unremarkable. HIV RNA was 164,000, CD4 count < 35 (chronically low), genotype and resistance screening indicated Tivicay  and Symtuza  were appropriate to continue, RPR titer returned 1: 4,096 (was 1:32 in July 2023), other cytologies were negative.   Review of fill history revealed patient picked up Bactrim  and Azithromycin  in April (both a 30 day supply). Tivicay  and Symtuza  were last filled in December 2024.   What do you think we can do to help you take your medications? (Pill packs, fill them here and have you pick them up?)  Immunizations:  Shingrix, PCV20, Tdap  Ask about headaches, changes to vision, balance issues, tinnitus  Plan: -Collect HIV RNA, HAV and HBV serologies  -Re-check RPR titer  -Restart  -F/u with Dr. Levern Reader in 1-2 months -   Tolu Yoana Staib, PharmD [facility] PGY-1

## 2023-08-15 ENCOUNTER — Telehealth: Payer: Self-pay | Admitting: Pharmacist

## 2023-08-15 NOTE — Telephone Encounter (Signed)
 Patient needs appointment ASAP with pharmacy. He missed appointment with me on 07/31/23. Can you all try to reach him?  Thanks!  Alan Geralds, PharmD, CPP, BCIDP, AAHIVP Clinical Pharmacist Practitioner Infectious Diseases Clinical Pharmacist [facility]

## 2023-08-25 NOTE — Telephone Encounter (Signed)
 Can we try to reach him one more time? Thank you!

## 2023-09-16 ENCOUNTER — Telehealth: Payer: Self-pay

## 2023-09-16 NOTE — Telephone Encounter (Signed)
 Hello [facility] Derek Donovan, cares about your health, and our records show that we may not have seen you since 05/19/2023. Your provider had asked to see you in 5 weeks from you last appointment. Please call [phone] to schedule an appointment.

## 2023-09-29 ENCOUNTER — Other Ambulatory Visit: Payer: Self-pay | Admitting: Pharmacist

## 2023-09-29 NOTE — Progress Notes (Signed)
 Disenrolling patient as he has not filled medications since December and has missed appointment.  Alan Geralds, PharmD, CPP, BCIDP, AAHIVP Clinical Pharmacist Practitioner Infectious Diseases Clinical Pharmacist [facility] Infectious Disease
